# Patient Record
Sex: Female | Born: 1985 | Race: White | Hispanic: Yes | Marital: Married | Smoking: Never smoker
Health system: Southern US, Community
[De-identification: ages and names within clinical notes are randomized; demographics above are authoritative.]

## PROBLEM LIST (undated history)

## (undated) DIAGNOSIS — R87629 Unspecified abnormal cytological findings in specimens from vagina: Secondary | ICD-10-CM

## (undated) DIAGNOSIS — O24419 Gestational diabetes mellitus in pregnancy, unspecified control: Secondary | ICD-10-CM

## (undated) HISTORY — DX: Gestational diabetes mellitus in pregnancy, unspecified control: O24.419

## (undated) HISTORY — DX: Unspecified abnormal cytological findings in specimens from vagina: R87.629

## (undated) HISTORY — PX: GALLBLADDER SURGERY: SHX652

---

## 2004-05-24 ENCOUNTER — Ambulatory Visit: Payer: Self-pay | Admitting: Family Medicine

## 2007-07-23 ENCOUNTER — Ambulatory Visit: Payer: Self-pay | Admitting: Family Medicine

## 2007-12-03 ENCOUNTER — Inpatient Hospital Stay: Payer: Self-pay | Admitting: Obstetrics and Gynecology

## 2014-12-04 ENCOUNTER — Other Ambulatory Visit: Payer: Self-pay | Admitting: Advanced Practice Midwife

## 2014-12-07 ENCOUNTER — Other Ambulatory Visit: Payer: Self-pay | Admitting: Advanced Practice Midwife

## 2014-12-08 ENCOUNTER — Ambulatory Visit: Admission: RE | Admit: 2014-12-08 | Payer: Self-pay | Source: Ambulatory Visit

## 2014-12-08 ENCOUNTER — Ambulatory Visit
Admission: RE | Admit: 2014-12-08 | Discharge: 2014-12-08 | Disposition: A | Discharge status: Home / Self Care | Payer: Medicaid Other | Source: Ambulatory Visit | Attending: Advanced Practice Midwife | Admitting: Advanced Practice Midwife

## 2014-12-08 DIAGNOSIS — Z36 Encounter for antenatal screening of mother: Secondary | ICD-10-CM | POA: Diagnosis present

## 2014-12-08 DIAGNOSIS — Z3A09 9 weeks gestation of pregnancy: Secondary | ICD-10-CM | POA: Diagnosis not present

## 2014-12-08 DIAGNOSIS — O3660X0 Maternal care for excessive fetal growth, unspecified trimester, not applicable or unspecified: Secondary | ICD-10-CM | POA: Insufficient documentation

## 2015-01-17 NOTE — L&D Delivery Note (Signed)
Delivery Note At 5:25 AM a viable female infant was delivered via Vaginal, Spontaneous Delivery (Presentation: LOA;  ).  APGAR: 8, 9; weight 8 lb 7.8 oz (3850 g).   Placenta status: Intact, Spontaneous.  Cord: 3 vessels with the following complications: None.    Anesthesia: None  Episiotomy: None Lacerations: 2nd degree Suture Repair: 2.0 3.0 vicryl Est. Blood Loss (mL):  400cc  Mom to postpartum.  Baby to Couplet care / Skin to Skin.  Leonette MostJohanna K Brailyn Killion 07/12/2015, 5:45 AM

## 2015-02-15 ENCOUNTER — Other Ambulatory Visit: Payer: Self-pay | Admitting: Advanced Practice Midwife

## 2015-02-15 DIAGNOSIS — Z3492 Encounter for supervision of normal pregnancy, unspecified, second trimester: Secondary | ICD-10-CM

## 2015-02-19 ENCOUNTER — Ambulatory Visit
Admission: RE | Admit: 2015-02-19 | Discharge: 2015-02-19 | Disposition: A | Discharge status: Home / Self Care | Payer: Self-pay | Source: Ambulatory Visit | Attending: Advanced Practice Midwife | Admitting: Advanced Practice Midwife

## 2015-02-19 DIAGNOSIS — Z3492 Encounter for supervision of normal pregnancy, unspecified, second trimester: Secondary | ICD-10-CM

## 2015-02-19 DIAGNOSIS — Z3482 Encounter for supervision of other normal pregnancy, second trimester: Secondary | ICD-10-CM | POA: Insufficient documentation

## 2015-05-06 ENCOUNTER — Encounter: Payer: Self-pay | Admitting: *Deleted

## 2015-05-06 ENCOUNTER — Encounter: Payer: Self-pay | Attending: Family Medicine | Admitting: *Deleted

## 2015-05-06 VITALS — BP 114/78 | Ht 60.0 in | Wt 162.2 lb

## 2015-05-06 DIAGNOSIS — O24410 Gestational diabetes mellitus in pregnancy, diet controlled: Secondary | ICD-10-CM

## 2015-05-06 DIAGNOSIS — O24419 Gestational diabetes mellitus in pregnancy, unspecified control: Secondary | ICD-10-CM | POA: Insufficient documentation

## 2015-05-06 NOTE — Patient Instructions (Signed)
Read booklet on Gestational Diabetes Follow Gestational Meal Planning Guidelines Check blood sugars 4 x day - before breakfast and 2 hrs after every meal and record  Bring blood sugar log to all appointments Call MD for prescription for meter strips and lancets Strips  Accu-Chek Aviva  Lancets  Accu-Check Fastclix Walk 20-30 minutes at least 5 x week if permitted by MD

## 2015-05-06 NOTE — Progress Notes (Signed)
Diabetes Self-Management Education  Visit Type: First/Initial  Appt. Start Time: 1320 Appt. End Time: 1500  05/06/2015  Rhonda Kramer, identified by name and date of birth, is a 30 y.o. female with a diagnosis of Diabetes: Gestational Diabetes.   ASSESSMENT  Blood pressure 114/78, height 5' (1.524 m), weight 162 lb 3.2 oz (73.573 kg), last menstrual period 10/08/2014. Body mass index is 31.68 kg/(m^2).      Diabetes Self-Management Education - 05/06/15 1523    Visit Information   Visit Type First/Initial   Initial Visit   Diabetes Type Gestational Diabetes   Are you currently following a meal plan? No   Are you taking your medications as prescribed? Yes   Date Diagnosed "few days ago"   Health Coping   How would you rate your overall health? Good   Psychosocial Assessment   Patient Belief/Attitude about Diabetes Motivated to manage diabetes   Self-care barriers English as a second language   Self-management support Doctor's office   Other persons present Interpreter   Patient Concerns Glycemic Control   Special Needs Other (comment)  Spanish handouts   Preferred Learning Style Auditory;Visual   Learning Readiness Ready   How often do you need to have someone help you when you read instructions, pamphlets, or other written materials from your doctor or pharmacy? 1 - Never   What is the last grade level you completed in school? high school   Complications   How often do you check your blood sugar? 0 times/day (not testing)  Provided Accu-Check Aviva Connect meter and instructed on use. BG upon return demonstration was 74 mg/L at 2:55 pm - 6 hrs pp   Have you had a dilated eye exam in the past 12 months? No   Have you had a dental exam in the past 12 months? No   Are you checking your feet? Yes   How many days per week are you checking your feet? 7   Dietary Intake   Breakfast eggs, beans, and makes her own fruit juice   Lunch chicken soup with rice or beans   Dinner small fruit/apple   Beverage(s) water   Exercise   Exercise Type Light (walking / raking leaves)   How many days per week to you exercise? 7   How many minutes per day do you exercise? 60   Total minutes per week of exercise 420   Patient Education   Previous Diabetes Education Yes (please comment)  GDM instruction at [facility] 10 years ago   Disease state  Definition of diabetes, type 1 and 2, and the diagnosis of diabetes   Nutrition management  Role of diet in the treatment of diabetes and the relationship between the three main macronutrients and blood glucose level   Physical activity and exercise  Role of exercise on diabetes management, blood pressure control and cardiac health.   Monitoring Taught/evaluated SMBG meter.;Purpose and frequency of SMBG.   Chronic complications Relationship between chronic complications and blood glucose control   Psychosocial adjustment Identified and addressed patients feelings and concerns about diabetes   Preconception care Pregnancy and GDM  Role of pre-pregnancy blood glucose control on the development of the fetus;Reviewed with patient blood glucose goals with pregnancy;Role of family planning for patients with diabetes   Personal strategies to promote health Helped patient develop diabetes management plan for (enter comment)  obtaining lower cost glucometer strips as patient doesn't have insurance   Individualized Goals (developed by patient)  Reducing Risk Other (comment)  Improving blood sugars   Outcomes   Expected Outcomes Demonstrated interest in learning. Expect positive outcomes   Program Status Not Completed      Individualized Plan for Diabetes Self-Management Training:   Learning Objective:  Patient will have a greater understanding of diabetes self-management. Patient education plan is to attend individual and/or group sessions per assessed needs and concerns.   Plan:   Patient Instructions  Read booklet on  Gestational Diabetes Follow Gestational Meal Planning Guidelines Check blood sugars 4 x day - before breakfast and 2 hrs after every meal and record  Bring blood sugar log to all appointments Call MD for prescription for meter strips and lancets Strips  Accu-Chek Aviva  Lancets  Accu-Check Fastclix Walk 20-30 minutes at least 5 x week if permitted by MD   Expected Outcomes:  Demonstrated interest in learning. Expect positive outcomes  Education material provided: (Spanish) Gestational Booklet Gestational Meal Planning Guidelines Viewed Gestational Diabetes Video Meter - Accu-Chek Aviva Connect Goals for a Healthy Pregnancy  If problems or questions, patient to contact team via:   Sharion SettlerSheila Akeria Hedstrom, RN, CCM, CDE [phone]  Future DSME appointment:  Pt doesn't have insurance and doesn't want to return for any further diabetes education at this time.

## 2015-06-23 LAB — OB RESULTS CONSOLE GC/CHLAMYDIA
CHLAMYDIA, DNA PROBE: NEGATIVE
Gonorrhea: NEGATIVE

## 2015-06-23 LAB — OB RESULTS CONSOLE GBS: STREP GROUP B AG: NEGATIVE

## 2015-07-10 ENCOUNTER — Encounter: Payer: Self-pay | Admitting: *Deleted

## 2015-07-10 ENCOUNTER — Observation Stay
Admission: EM | Admit: 2015-07-10 | Discharge: 2015-07-11 | Disposition: A | Discharge status: Home / Self Care | Payer: Medicaid Other | Source: Home / Self Care | Admitting: Obstetrics and Gynecology

## 2015-07-10 DIAGNOSIS — Z3A40 40 weeks gestation of pregnancy: Secondary | ICD-10-CM

## 2015-07-10 DIAGNOSIS — O24410 Gestational diabetes mellitus in pregnancy, diet controlled: Secondary | ICD-10-CM

## 2015-07-10 DIAGNOSIS — O479 False labor, unspecified: Secondary | ICD-10-CM | POA: Diagnosis present

## 2015-07-10 DIAGNOSIS — O471 False labor at or after 37 completed weeks of gestation: Secondary | ICD-10-CM

## 2015-07-10 LAB — GLUCOSE, CAPILLARY: GLUCOSE-CAPILLARY: 73 mg/dL (ref 65–99)

## 2015-07-10 MED ORDER — CALCIUM CARBONATE ANTACID 500 MG PO CHEW: 2.0000 | CHEWABLE_TABLET | ORAL | Status: DC | PRN

## 2015-07-10 MED ORDER — ACETAMINOPHEN 325 MG PO TABS: 650.0000 mg | ORAL_TABLET | ORAL | Status: DC | PRN

## 2015-07-10 MED ORDER — PRENATAL MULTIVITAMIN CH: 1.0000 | ORAL_TABLET | Freq: Every day | ORAL | Status: DC

## 2015-07-10 MED ORDER — DOCUSATE SODIUM 100 MG PO CAPS: 100.0000 mg | ORAL_CAPSULE | Freq: Every day | ORAL | Status: DC

## 2015-07-10 MED ORDER — ZOLPIDEM TARTRATE 5 MG PO TABS: 5.0000 mg | ORAL_TABLET | Freq: Every evening | ORAL | Status: DC | PRN

## 2015-07-10 NOTE — Progress Notes (Signed)
Pt. Ambulating hallway per MD order, telemetry monitors attempted - unable to trace fetal heart rate while walking hallway.  Pt. Instructed to return to room in 30-40 minutes to obtain a 20 minute fetal tracing. Pt. Will be allowed to continue to walk if fetal tracing reactive.

## 2015-07-10 NOTE — OB Triage Note (Signed)
Contractions started today at 6 p.m., contractions getting stronger, no gush of fluid, positive for fetal movement.  Pt. States, she has not checked her blood sugar today.  Interpreter at the bedside.

## 2015-07-11 NOTE — Discharge Summary (Signed)
MD NOTE  LMP: 10/08/14 EDD: 07/11/15  HPI: [id number] G3P2002 @ 40+0 wks here for CTX. A1GDM, FS well controlled. No lof, no vb. Pain tolerable.   APC: [facility] Issues this pregnancy 1. A1GDM  History OB History    Gravida Para Term Preterm AB TAB SAB Ectopic Multiple Living   3 2 2       2      Past Medical History  Diagnosis Date  . Gestational diabetes    History reviewed. No pertinent past surgical history. Family History: family history includes Diabetes in her mother. Social History:  reports that she has never smoked. She has never used smokeless tobacco. She reports that she does not drink alcohol or use illicit drugs.   ROS Otherwise neg  Dilation: 3 Effacement (%): 70 Station: -3 Exam by:: Millner, RN  same over 2 hours Blood pressure 138/81, pulse 64, temperature 98.2 F (36.8 C), temperature source Oral, weight 75.751 kg (167 lb), last menstrual period 10/08/2014.  Exam Physical Exam  General appearance: alert and no distress Lungs: clear to auscultation bilaterally Heart: regular rate and rhythm, S1, S2 normal, no murmur, click, rub or gallop Abdomen: soft, non-tender; bowel sounds normal; no masses,  no organomegaly, EFW 4000g Extremities: extremities normal, atraumatic, no cyanosis or edema Skin: Skin color, texture, turgor normal. No rashes or lesions Prenatal labs: See [facility] records  GBS: Negative (06/07 0000)   EFM: 150 mod var +accels, no decels Toco: q446min  Assessment/Plan: 30yo G3P2002 @ 40+0 wks with A1GDM with ctx but no cervical change. Patient has f/u appt on Tuesday to plan IOL given A1GDM. Offered IOL tonight, but patient prefers to wait to see if she continues to ctx on her own. Precautions given. Fs 75 in triage. Stable for d/c home.   Loistine SimasJohanna K Jaylene Schrom 07/11/2015, 12:00 PM

## 2015-07-11 NOTE — Discharge Instructions (Signed)
Spanish interpreter at the bedside:  Labor precautions (decreased fetal movement, fetal kick count, contractions, gush of fluid, vaginal bleeding) discussed with patient, pt. verbalized understanding. Blood sugar checks also discussed with patient.  Spouse at the bedside, very supportive. Pt. Has follow-up appt. On Tuesday, July 13, 2015 [facility]; pt. Encouraged to keep appt.

## 2015-07-12 ENCOUNTER — Inpatient Hospital Stay
Admission: RE | Admit: 2015-07-12 | Discharge: 2015-07-14 | DRG: 775 | Disposition: A | Discharge status: Home / Self Care | Payer: Medicaid Other | Source: Ambulatory Visit | Attending: Obstetrics and Gynecology | Admitting: Obstetrics and Gynecology

## 2015-07-12 DIAGNOSIS — Z3A40 40 weeks gestation of pregnancy: Secondary | ICD-10-CM

## 2015-07-12 DIAGNOSIS — O24424 Gestational diabetes mellitus in childbirth, insulin controlled: Secondary | ICD-10-CM | POA: Diagnosis present

## 2015-07-12 LAB — CBC
HEMATOCRIT: 37.2 % (ref 35.0–47.0)
HEMOGLOBIN: 12.7 g/dL (ref 12.0–16.0)
MCH: 31.9 pg (ref 26.0–34.0)
MCHC: 34.3 g/dL (ref 32.0–36.0)
MCV: 93.1 fL (ref 80.0–100.0)
Platelets: 178 10*3/uL (ref 150–440)
RBC: 3.99 MIL/uL (ref 3.80–5.20)
RDW: 16.5 % — ABNORMAL HIGH (ref 11.5–14.5)
WBC: 7.5 10*3/uL (ref 3.6–11.0)

## 2015-07-12 LAB — GLUCOSE, CAPILLARY: Glucose-Capillary: 107 mg/dL — ABNORMAL HIGH (ref 65–99)

## 2015-07-12 LAB — TYPE AND SCREEN
ABO/RH(D): O POS
Antibody Screen: NEGATIVE

## 2015-07-12 MED ORDER — DIPHENHYDRAMINE HCL 25 MG PO CAPS: 25.0000 mg | ORAL_CAPSULE | Freq: Four times a day (QID) | ORAL | Status: DC | PRN

## 2015-07-12 MED ORDER — IBUPROFEN 600 MG PO TABS
600.0000 mg | ORAL_TABLET | Freq: Four times a day (QID) | ORAL | Status: DC
  Administered 2015-07-12 (×2): 600 mg via ORAL
  Filled 2015-07-12 (×3): qty 1

## 2015-07-12 MED ORDER — SENNOSIDES-DOCUSATE SODIUM 8.6-50 MG PO TABS
2.0000 | ORAL_TABLET | ORAL | Status: DC
  Administered 2015-07-12: 2 via ORAL

## 2015-07-12 MED ORDER — SIMETHICONE 80 MG PO CHEW: 80.0000 mg | CHEWABLE_TABLET | ORAL | Status: DC | PRN

## 2015-07-12 MED ORDER — SODIUM CHLORIDE 0.9% FLUSH: 3.0000 mL | Freq: Two times a day (BID) | INTRAVENOUS | Status: DC

## 2015-07-12 MED ORDER — OXYTOCIN BOLUS FROM INFUSION: 500.0000 mL | INTRAVENOUS | Status: DC

## 2015-07-12 MED ORDER — SOD CITRATE-CITRIC ACID 500-334 MG/5ML PO SOLN: 30.0000 mL | ORAL | Status: DC | PRN

## 2015-07-12 MED ORDER — OXYCODONE-ACETAMINOPHEN 5-325 MG PO TABS
1.0000 | ORAL_TABLET | ORAL | Status: DC | PRN
  Administered 2015-07-12: 1 via ORAL
  Filled 2015-07-12: qty 1

## 2015-07-12 MED ORDER — BUTORPHANOL TARTRATE 1 MG/ML IJ SOLN: 1.0000 mg | INTRAMUSCULAR | Status: DC | PRN

## 2015-07-12 MED ORDER — LIDOCAINE HCL (PF) 1 % IJ SOLN
INTRAMUSCULAR | Status: AC
  Administered 2015-07-12: 30 mL via SUBCUTANEOUS
  Filled 2015-07-12: qty 30

## 2015-07-12 MED ORDER — BENZOCAINE-MENTHOL 20-0.5 % EX AERO: 1.0000 "application " | INHALATION_SPRAY | CUTANEOUS | Status: DC | PRN

## 2015-07-12 MED ORDER — OXYCODONE-ACETAMINOPHEN 5-325 MG PO TABS
2.0000 | ORAL_TABLET | ORAL | Status: DC | PRN
  Administered 2015-07-12: 2 via ORAL
  Filled 2015-07-12: qty 2

## 2015-07-12 MED ORDER — SODIUM CHLORIDE 0.9 % IV SOLN: 250.0000 mL | INTRAVENOUS | Status: DC | PRN

## 2015-07-12 MED ORDER — TETANUS-DIPHTH-ACELL PERTUSSIS 5-2.5-18.5 LF-MCG/0.5 IM SUSP
0.5000 mL | Freq: Once | INTRAMUSCULAR | Status: AC
  Administered 2015-07-14: 0.5 mL via INTRAMUSCULAR
  Filled 2015-07-12: qty 0.5

## 2015-07-12 MED ORDER — ONDANSETRON HCL 4 MG/2ML IJ SOLN: 4.0000 mg | INTRAMUSCULAR | Status: DC | PRN

## 2015-07-12 MED ORDER — WITCH HAZEL-GLYCERIN EX PADS: 1.0000 "application " | MEDICATED_PAD | CUTANEOUS | Status: DC | PRN

## 2015-07-12 MED ORDER — OXYCODONE HCL 5 MG PO TABS
5.0000 mg | ORAL_TABLET | ORAL | Status: DC | PRN
  Administered 2015-07-14: 5 mg via ORAL
  Filled 2015-07-12: qty 1

## 2015-07-12 MED ORDER — ACETAMINOPHEN 325 MG PO TABS: 650.0000 mg | ORAL_TABLET | ORAL | Status: DC | PRN

## 2015-07-12 MED ORDER — ZOLPIDEM TARTRATE 5 MG PO TABS: 5.0000 mg | ORAL_TABLET | Freq: Every evening | ORAL | Status: DC | PRN

## 2015-07-12 MED ORDER — PRENATAL MULTIVITAMIN CH
1.0000 | ORAL_TABLET | Freq: Every day | ORAL | Status: DC
  Administered 2015-07-13 – 2015-07-14 (×2): 1 via ORAL
  Filled 2015-07-12 (×2): qty 1

## 2015-07-12 MED ORDER — OXYTOCIN 10 UNIT/ML IJ SOLN
INTRAMUSCULAR | Status: AC
  Filled 2015-07-12: qty 2

## 2015-07-12 MED ORDER — COCONUT OIL OIL: 1.0000 "application " | TOPICAL_OIL | Status: DC | PRN

## 2015-07-12 MED ORDER — SODIUM CHLORIDE 0.9% FLUSH: 3.0000 mL | INTRAVENOUS | Status: DC | PRN

## 2015-07-12 MED ORDER — OXYCODONE HCL 5 MG PO TABS: 10.0000 mg | ORAL_TABLET | ORAL | Status: DC | PRN

## 2015-07-12 MED ORDER — LACTATED RINGERS IV SOLN
INTRAVENOUS | Status: DC
  Administered 2015-07-12: 04:00:00 via INTRAVENOUS

## 2015-07-12 MED ORDER — MISOPROSTOL 200 MCG PO TABS
ORAL_TABLET | ORAL | Status: AC
  Filled 2015-07-12: qty 4

## 2015-07-12 MED ORDER — LIDOCAINE HCL (PF) 1 % IJ SOLN
30.0000 mL | INTRAMUSCULAR | Status: DC | PRN
  Administered 2015-07-12: 30 mL via SUBCUTANEOUS

## 2015-07-12 MED ORDER — OXYTOCIN 40 UNITS IN LACTATED RINGERS INFUSION - SIMPLE MED
2.5000 [IU]/h | INTRAVENOUS | Status: DC
  Administered 2015-07-12: 2.5 [IU]/h via INTRAVENOUS
  Filled 2015-07-12: qty 1000

## 2015-07-12 MED ORDER — ONDANSETRON HCL 4 MG PO TABS: 4.0000 mg | ORAL_TABLET | ORAL | Status: DC | PRN

## 2015-07-12 MED ORDER — ONDANSETRON HCL 4 MG/2ML IJ SOLN: 4.0000 mg | Freq: Four times a day (QID) | INTRAMUSCULAR | Status: DC | PRN

## 2015-07-12 MED ORDER — LACTATED RINGERS IV SOLN: 500.0000 mL | INTRAVENOUS | Status: DC | PRN

## 2015-07-12 MED ORDER — DIBUCAINE 1 % RE OINT: 1.0000 "application " | TOPICAL_OINTMENT | RECTAL | Status: DC | PRN

## 2015-07-12 MED ORDER — AMMONIA AROMATIC IN INHA
RESPIRATORY_TRACT | Status: AC
  Filled 2015-07-12: qty 10

## 2015-07-12 NOTE — OB Triage Note (Signed)
Pt. states she experienced contractions all day yesterday, but they began to intensify and increase in frequency at approximately 2330. She sates she has had some blood-tinged mucous discharge but denies other VB or LOF. Endorses good fetal movement.

## 2015-07-12 NOTE — H&P (Signed)
MD NOTE  LMP: 10/08/14 EDD: 07/11/15  HPI: [id number] G3P2002 @ 40+1 wks here for CTX. A1GDM, FS well controlled. No lof, no vb. More uncomfortable from yesterday.  APC: [facility] Issues this pregnancy 1. A1GDM  History OB History    Gravida Para Term Preterm AB TAB SAB Ectopic Multiple Living   3 2 2       2      Past Medical History  Diagnosis Date  . Gestational diabetes    History reviewed. No pertinent past surgical history. Family History: family history includes Diabetes in her mother. Social History:  reports that she has never smoked. She has never used smokeless tobacco. She reports that she does not drink alcohol or use illicit drugs.   ROS Otherwise neg  Dilation: 6 Effacement (%): 100 Station: -2  AROM'd for tinged mec  There were no vitals filed for this visit.   Exam Physical Exam  General appearance: alert and no distress Lungs: clear to auscultation bilaterally Heart: regular rate and rhythm, S1, S2 normal, no murmur, click, rub or gallop Abdomen: soft, non-tender; bowel sounds normal; no masses, no organomegaly, EFW 4000g Extremities: extremities normal, atraumatic, no cyanosis or edema Skin: Skin color, texture, turgor normal. No rashes or lesions Prenatal labs: See [facility] records RI, VZV I, Hbsag neg, RPR NR, HIV neg, O+,  GC/CT neg GBS: Negative (06/07 0000)   EFM: 150 mod var +accels, no decels Toco: q442min  Assessment/Plan: 30yo G3P2002 @ 40+1 wks with A1GDM in labor.  - FS now and q2H in active labor - cont EFM,toco - EFW 4300g, shoulder precautions - GBS neg  Anticipate NSVD  Ala DachJohanna K Pippa Hanif, MD

## 2015-07-12 NOTE — OB Triage Note (Signed)
[facility]. Pt placed on monitor and cervical exam done.

## 2015-07-13 LAB — CBC
HEMATOCRIT: 36.2 % (ref 35.0–47.0)
HEMOGLOBIN: 12.4 g/dL (ref 12.0–16.0)
MCH: 31.6 pg (ref 26.0–34.0)
MCHC: 34.2 g/dL (ref 32.0–36.0)
MCV: 92.5 fL (ref 80.0–100.0)
Platelets: 166 10*3/uL (ref 150–440)
RBC: 3.92 MIL/uL (ref 3.80–5.20)
RDW: 16.4 % — ABNORMAL HIGH (ref 11.5–14.5)
WBC: 8.4 10*3/uL (ref 3.6–11.0)

## 2015-07-13 LAB — RPR: RPR Ser Ql: NONREACTIVE

## 2015-07-13 MED ORDER — IBUPROFEN 600 MG PO TABS
600.0000 mg | ORAL_TABLET | Freq: Four times a day (QID) | ORAL | Status: DC
  Administered 2015-07-13 – 2015-07-14 (×7): 600 mg via ORAL
  Filled 2015-07-13 (×6): qty 1

## 2015-07-13 NOTE — Progress Notes (Signed)
Post Partum Day 1 Subjective: no complaints Baby bili elevated  Objective: Blood pressure 126/77, pulse 66, temperature 98.5 F (36.9 C), temperature source Oral, resp. rate 18, last menstrual period 10/08/2014, SpO2 100 %, unknown if currently breastfeeding.  Physical Exam:  General: alert and cooperative Lochia: appropriate Uterine Fundus: firm Incision: n/a DVT Evaluation: No evidence of DVT seen on physical exam.   Recent Labs  07/12/15 0345 07/13/15 0452  HGB 12.7 12.4  HCT 37.2 36.2    Assessment/Plan: Plan for discharge tomorrow given bilirubin ( baby) elevated    LOS: 1 day   SCHERMERHORN,THOMAS 07/13/2015, 8:51 AM

## 2015-07-14 MED ORDER — IBUPROFEN 600 MG PO TABS: 600.0000 mg | ORAL_TABLET | Freq: Four times a day (QID) | ORAL | Status: DC

## 2015-07-14 MED ORDER — BENZOCAINE-MENTHOL 20-0.5 % EX AERO: 1.0000 "application " | INHALATION_SPRAY | CUTANEOUS | Status: DC | PRN

## 2015-07-14 NOTE — Discharge Summary (Signed)
Obstetric Discharge Summary Reason for Admission: onset of labor Prenatal Procedures: none Intrapartum Procedures: spontaneous vaginal delivery Postpartum Procedures: none Complications-Operative and Postpartum: none HEMOGLOBIN  Date Value Ref Range Status  07/13/2015 12.4 12.0 - 16.0 g/dL Final   HCT  Date Value Ref Range Status  07/13/2015 36.2 35.0 - 47.0 % Final    Physical Exam:  General: alert, cooperative and appears stated age 55Lochia: appropriate Uterine Fundus: firm Incision: healing well DVT Evaluation: No evidence of DVT seen on physical exam.  Discharge Diagnoses: Term Pregnancy-delivered  Discharge Information: Date: 07/14/2015 Activity: pelvic rest Diet: routine Medications: Ibuprofen Condition: stable Instructions: refer to practice specific booklet Discharge to: home - however, baby bilirubin still elevated and if not able to go home today, patient will stay as peds parent Follow-up Information    Follow up with Ala DachJohanna K Halfon, MD In 6 weeks.   Specialty:  Obstetrics and Gynecology   Why:  For postpartum visit   Contact information:   [address] [place] [id number] [phone]       Newborn Data: Live born female  Birth Weight: 8 lb 7.8 oz (3850 g) APGAR: 8, 9  Home with mother, pending bilirubin.  Christeen DouglasBEASLEY, Shiri Hodapp 07/14/2015, 8:38 AM

## 2015-07-14 NOTE — Progress Notes (Signed)
pt discharged home with infant.  Discharge instructions, prescriptions and follow up appointment given to and reviewed with pt.  Pt verbalized understanding, all questions answered.  Escorted by auxiliary. 

## 2015-07-14 NOTE — Discharge Instructions (Signed)
Parto vaginal, Cuidados posteriores  °(Vaginal Delivery, Care After) °Siga estas instrucciones durante las próximas semanas. Estas indicaciones para el alta le proporcionan información general acerca de cómo deberá cuidarse después del parto. El médico también podrá darle instrucciones específicas. El tratamiento ha sido planificado según las prácticas médicas actuales, pero en algunos casos pueden ocurrir problemas. Comuníquese con el médico si tiene algún problema o tiene preguntas al volver a su casa.  °INSTRUCCIONES PARA EL CUIDADO EN EL HOGAR  °· Tome sólo medicamentos de venta libre o recetados, según las indicaciones del médico o del farmacéutico. °· No beba alcohol, especialmente si está amamantando o toma analgésicos. °· No mastique tabaco ni fume. °· No consuma drogas. °· Continúe con un adecuado cuidado perineal. El buen cuidado perineal incluye: °¨ Higienizarse de adelante hacia atrás. °¨ Mantener la zona perineal limpia. °· No use tampones ni duchas vaginales hasta que su médico la autorice. °· Dúchese, lávese el cabello y tome baños de inmersión según las indicaciones de su médico. °· Utilice un sostén que le ajuste bien y que brinde buen soporte a sus mamas. °· Consuma alimentos saludables. °· Beba suficiente líquido para mantener la orina clara o de color amarillo pálido. °· Consuma alimentos ricos en fibra como cereales y panes integrales, arroz, frijoles y frutas y verduras frescas todos los días. Estos alimentos pueden ayudarla a prevenir o aliviar el estreñimiento. °· Siga las recomendaciones de su médico relacionadas con la reanudación de actividades como subir escaleras, conducir automóviles, levantar objetos, hacer ejercicios o viajar. °· Hable con su médico acerca de reanudar la actividad sexual. Volver a la actividad sexual depende del riesgo de infección, la velocidad de la curación y la comodidad y su deseo de reanudarla. °· Trate de que alguien la ayude con las actividades del hogar y con  el recién nacido al menos durante un par de días después de salir del hospital. °· Descanse todo lo que pueda. Trate de descansar o tomar una siesta mientras el bebé está durmiendo. °· Aumente sus actividades gradualmente. °· Cumpla con todas las visitas de control programadas para después del parto. Es muy importante asistir a todas las citas programadas de seguimiento. En estas citas, su médico va a controlarla para asegurarse de que esté sanando física y emocionalmente. °SOLICITE ATENCIÓN MÉDICA SI:  °· Elimina coágulos grandes por la vagina. Guarde algunos coágulos para mostrarle al médico. °· Tiene una secreción con feo olor que proviene de la vagina. °· Tiene dificultad para orinar. °· Orina con frecuencia. °· Siente dolor al orinar. °· Nota un cambio en sus movimientos intestinales. °· Aumenta el enrojecimiento, el dolor o la hinchazón en la zona de la incisión vaginal (episiotomía) o el desgarro vaginal. °· Tiene pus que drena por la episiotomía o el desgarro vaginal. °· La episiotomía o el desgarro vaginal se abren. °· Sus mamas le duelen, están duras o enrojecidas. °· Sufre un dolor intenso de cabeza. °· Tiene visión borrosa o ve manchas. °· Se siente triste o deprimida. °· Tiene pensamientos acerca de lastimarse o dañar al recién nacido. °· Tiene preguntas acerca de su cuidado personal, el cuidado del recién nacido o acerca de los medicamentos. °· Se siente mareada o sufre un desmayo. °· Tiene una erupción. °· Tiene náuseas o vómitos. °· Usted amamantó al bebé y no ha tenido su período menstrual dentro de las 12 semanas después de dejar de amamantar. °· No amamanta al bebé y no tuvo su período menstrual en las últimas 12° semanas después del   parto. °· Tiene fiebre. °SOLICITE ATENCIÓN MÉDICA DE INMEDIATO SI:  °· Siente dolor persistente. °· Siente dolor en el pecho. °· Le falta el aire. °· Se desmaya. °· Siente dolor en la pierna. °· Siente dolor en el estómago. °· El sangrado vaginal satura dos o más  apósitos en 1 hora. °  °Esta información no tiene como fin reemplazar el consejo del médico. Asegúrese de hacerle al médico cualquier pregunta que tenga. °  °Document Released: 01/02/2005 Document Revised: 09/23/2014 °Elsevier Interactive Patient Education ©2016 [employer] Inc. ° °

## 2018-10-02 ENCOUNTER — Encounter: Payer: Self-pay | Admitting: Emergency Medicine

## 2018-10-02 ENCOUNTER — Emergency Department
Admission: EM | Admit: 2018-10-02 | Discharge: 2018-10-02 | Disposition: A | Discharge status: Home / Self Care | Payer: Self-pay | Attending: Emergency Medicine | Admitting: Emergency Medicine

## 2018-10-02 ENCOUNTER — Other Ambulatory Visit: Payer: Self-pay

## 2018-10-02 ENCOUNTER — Emergency Department: Payer: Self-pay

## 2018-10-02 DIAGNOSIS — K8042 Calculus of bile duct with acute cholecystitis without obstruction: Secondary | ICD-10-CM | POA: Insufficient documentation

## 2018-10-02 DIAGNOSIS — R1011 Right upper quadrant pain: Secondary | ICD-10-CM | POA: Insufficient documentation

## 2018-10-02 DIAGNOSIS — K8050 Calculus of bile duct without cholangitis or cholecystitis without obstruction: Secondary | ICD-10-CM

## 2018-10-02 DIAGNOSIS — Z79899 Other long term (current) drug therapy: Secondary | ICD-10-CM | POA: Insufficient documentation

## 2018-10-02 LAB — URINALYSIS, COMPLETE (UACMP) WITH MICROSCOPIC
Bacteria, UA: NONE SEEN
Bilirubin Urine: NEGATIVE
Glucose, UA: NEGATIVE mg/dL
Hgb urine dipstick: NEGATIVE
Ketones, ur: NEGATIVE mg/dL
Leukocytes,Ua: NEGATIVE
Nitrite: NEGATIVE
Protein, ur: NEGATIVE mg/dL
Specific Gravity, Urine: 1.015 (ref 1.005–1.030)
pH: 8 (ref 5.0–8.0)

## 2018-10-02 LAB — CBC WITH DIFFERENTIAL/PLATELET
Abs Immature Granulocytes: 0.03 10*3/uL (ref 0.00–0.07)
Basophils Absolute: 0 10*3/uL (ref 0.0–0.1)
Basophils Relative: 0 %
Eosinophils Absolute: 0.1 10*3/uL (ref 0.0–0.5)
Eosinophils Relative: 1 %
HCT: 37.9 % (ref 36.0–46.0)
Hemoglobin: 12.6 g/dL (ref 12.0–15.0)
Immature Granulocytes: 0 %
Lymphocytes Relative: 38 %
Lymphs Abs: 3.5 10*3/uL (ref 0.7–4.0)
MCH: 29.8 pg (ref 26.0–34.0)
MCHC: 33.2 g/dL (ref 30.0–36.0)
MCV: 89.6 fL (ref 80.0–100.0)
Monocytes Absolute: 0.7 10*3/uL (ref 0.1–1.0)
Monocytes Relative: 8 %
Neutro Abs: 4.8 10*3/uL (ref 1.7–7.7)
Neutrophils Relative %: 53 %
Platelets: 296 10*3/uL (ref 150–400)
RBC: 4.23 MIL/uL (ref 3.87–5.11)
RDW: 12.7 % (ref 11.5–15.5)
WBC: 9.2 10*3/uL (ref 4.0–10.5)
nRBC: 0 % (ref 0.0–0.2)

## 2018-10-02 LAB — POCT PREGNANCY, URINE: Preg Test, Ur: NEGATIVE

## 2018-10-02 LAB — LIPASE, BLOOD: Lipase: 28 U/L (ref 11–51)

## 2018-10-02 LAB — BASIC METABOLIC PANEL
Anion gap: 9 (ref 5–15)
BUN: 14 mg/dL (ref 6–20)
CO2: 25 mmol/L (ref 22–32)
Calcium: 9.7 mg/dL (ref 8.9–10.3)
Chloride: 105 mmol/L (ref 98–111)
Creatinine, Ser: 0.64 mg/dL (ref 0.44–1.00)
GFR calc Af Amer: >60 mL/min (ref >60)
GFR calc non Af Amer: >60 mL/min (ref >60)
Glucose, Bld: 102 mg/dL — ABNORMAL HIGH (ref 70–99)
Potassium: 4.4 mmol/L (ref 3.5–5.1)
Sodium: 139 mmol/L (ref 135–145)

## 2018-10-02 LAB — HEPATIC FUNCTION PANEL
ALT: 34 U/L (ref 0–44)
AST: 37 U/L (ref 15–41)
Albumin: 4.5 g/dL (ref 3.5–5.0)
Alkaline Phosphatase: 71 U/L (ref 38–126)
Bilirubin, Direct: 0.1 mg/dL (ref 0.0–0.2)
Indirect Bilirubin: 0.7 mg/dL (ref 0.3–0.9)
Total Bilirubin: 0.8 mg/dL (ref 0.3–1.2)
Total Protein: 8 g/dL (ref 6.5–8.1)

## 2018-10-02 MED ORDER — ONDANSETRON HCL 4 MG/2ML IJ SOLN
4.0000 mg | Freq: Once | INTRAMUSCULAR | Status: AC
  Administered 2018-10-02: 4 mg via INTRAVENOUS
  Filled 2018-10-02: qty 2

## 2018-10-02 MED ORDER — MORPHINE SULFATE (PF) 4 MG/ML IV SOLN
8.0000 mg | Freq: Once | INTRAVENOUS | Status: AC
  Administered 2018-10-02: 15:00:00 8 mg via INTRAVENOUS
  Filled 2018-10-02: qty 2

## 2018-10-02 MED ORDER — LACTATED RINGERS IV BOLUS
1000.0000 mL | Freq: Once | INTRAVENOUS | Status: AC
  Administered 2018-10-02: 1000 mL via INTRAVENOUS

## 2018-10-02 MED ORDER — OXYCODONE-ACETAMINOPHEN 5-325 MG PO TABS: 1.0000 | ORAL_TABLET | ORAL | 0 refills | Status: AC | PRN

## 2018-10-02 MED ORDER — ONDANSETRON 4 MG PO TBDP: 4.0000 mg | ORAL_TABLET | Freq: Three times a day (TID) | ORAL | 0 refills | Status: DC | PRN

## 2018-10-02 NOTE — ED Notes (Signed)
This RN d/c patient with translator- Rafael. Pt and partner verbalized understanding of discharge instructions.

## 2018-10-02 NOTE — ED Notes (Signed)
Pt assisted to the bathroom at this time. Pt c/o dizziness, ambulatory with standby assist. UA collected at this time. Pt back to bed without issue. Will continue to monitor for further patient needs.

## 2018-10-02 NOTE — ED Triage Notes (Addendum)
Pt presents to ED via [facility] with c/o RUQ abdominal pain. Per EMS pt vomited this morning and had sudden onset RUQ abdominal pain. Pt presents to ED noted to be moaning in pain. Per EMS pt with significant guarding to RUQ, but no apparent pain to other quadrants with palpation.   20 g to L hand  CBG 106 156/88 98% RA 84HR  Ronnald Collum, Interpreter at bedside to interpret patient, pt reports that pain has been intermittent x 2 months, seen by  Her PCP and was told that she had a virus. Today had sudden onset again with severe pain.

## 2018-10-02 NOTE — ED Notes (Signed)
Pt returned from US at this time.

## 2018-10-02 NOTE — ED Provider Notes (Signed)
[facility] Emergency Department Provider Note   ____________________________________________   First MD Initiated Contact with Patient 10/02/18 1450     (approximate)  I have reviewed the triage vital signs and the nursing notes.   HISTORY  Chief Complaint Abdominal Cramping    HPI Rhonda Kramer is a 33 y.o. female with no significant past medical history presents to the ED complaining of abdominal pain.  History obtained via in person interpreter given patient is Spanish-speaking only.  Patient reports she has been dealing with intermittent right upper quadrant abdominal pain for at least the past 2 months.  It has been sharp and not exacerbated or alleviated by anything.  She states she was seen by her PCP for this problem and told her it was a virus.  States she woke up this morning, pain in her right upper quadrant has been sharp and severe.  It has persisted throughout the day and so she eventually called EMS.  She had one episode of vomiting earlier today and continues to feel nauseous.  She states her LMP was the 22nd of last month, denies any vaginal bleeding, discharge, dysuria, or hematuria.        Past Medical History:  Diagnosis Date  . Gestational diabetes     Patient Active Problem List   Diagnosis Date Noted  . Normal labor and delivery 07/12/2015  . Irregular uterine contractions 07/10/2015    History reviewed. No pertinent surgical history.  Prior to Admission medications   Medication Sig Start Date End Date Taking? Authorizing Provider  benzocaine-Menthol (DERMOPLAST) 20-0.5 % AERO Apply 1 application topically as needed for irritation (perineal discomfort). Patient not taking: Reported on 10/02/2018 07/14/15   Benjaman Kindler, MD  ibuprofen (ADVIL,MOTRIN) 600 MG tablet Take 1 tablet (600 mg total) by mouth every 6 (six) hours. Patient not taking: Reported on 10/02/2018 07/14/15   Benjaman Kindler, MD  ondansetron (ZOFRAN ODT)  4 MG disintegrating tablet Take 1 tablet (4 mg total) by mouth every 8 (eight) hours as needed for nausea or vomiting. 10/02/18   Blake Divine, MD  oxyCODONE-acetaminophen (PERCOCET) 5-325 MG tablet Take 1 tablet by mouth every 4 (four) hours as needed for up to 5 days for severe pain. 10/02/18 10/07/18  Blake Divine, MD  Prenatal Vit-Fe Fumarate-FA (PRENATAL VITAMIN PO) Take 1 tablet by mouth daily.    [provider]    Allergies Patient has no known allergies.  Family History  Problem Relation Age of Onset  . Diabetes Mother     Social History Social History   Tobacco Use  . Smoking status: Never Smoker  . Smokeless tobacco: Never Used  Substance Use Topics  . Alcohol use: No    Alcohol/week: 0.0 standard drinks  . Drug use: No    Review of Systems  Constitutional: No fever/chills Eyes: No visual changes. ENT: No sore throat. Cardiovascular: Denies chest pain. Respiratory: Denies shortness of breath. Gastrointestinal: Positive for abdominal pain, nausea, and vomiting.  No diarrhea.  No constipation. Genitourinary: Negative for dysuria. Musculoskeletal: Negative for back pain. Skin: Negative for rash. Neurological: Negative for headaches, focal weakness or numbness.  ____________________________________________   PHYSICAL EXAM:  VITAL SIGNS: ED Triage Vitals [10/02/18 1450]  Enc Vitals Group     BP      Pulse      Resp      Temp      Temp src      SpO2      Weight  Height      Head Circumference      Peak Flow      Pain Score 9     Pain Loc      Pain Edu?      Excl. in GC?     Constitutional: Alert and oriented. Eyes: Conjunctivae are normal. Head: Atraumatic. Nose: No congestion/rhinnorhea. Mouth/Throat: Mucous membranes are moist. Neck: Normal ROM Cardiovascular: Normal rate, regular rhythm. Grossly normal heart sounds. Respiratory: Normal respiratory effort.  No retractions. Lungs CTAB. Gastrointestinal: Soft and tender to  palpation in the right upper quadrant with voluntary guarding, otherwise nontender. No distention. Genitourinary: deferred Musculoskeletal: No lower extremity tenderness nor edema. Neurologic:  Normal speech and language. No gross focal neurologic deficits are appreciated. Skin:  Skin is warm, dry and intact. No rash noted. Psychiatric: Mood and affect are normal. Speech and behavior are normal.  ____________________________________________   LABS (all labs ordered are listed, but only abnormal results are displayed)  Labs Reviewed  URINALYSIS, COMPLETE (UACMP) WITH MICROSCOPIC - Abnormal; Notable for the following components:      Result Value   Color, Urine YELLOW (*)    APPearance CLEAR (*)    All other components within normal limits  BASIC METABOLIC PANEL - Abnormal; Notable for the following components:   Glucose, Bld 102 (*)    All other components within normal limits  CBC WITH DIFFERENTIAL/PLATELET  LIPASE, BLOOD  HEPATIC FUNCTION PANEL  POC URINE PREG, ED  POCT PREGNANCY, URINE     PROCEDURES  Procedure(s) performed (including Critical Care):  Procedures   ____________________________________________   INITIAL IMPRESSION / ASSESSMENT AND PLAN / ED COURSE       33 year old female presenting to the ED with intermittent right upper quadrant pain over the past couple of months, now severe with an episode of vomiting today.  This is most consistent with cholecystitis versus biliary colic, will assess with right upper quadrant ultrasound, LFTs, and lipase.  Patient in severe pain at this time, will treat with morphine, Zofran, and IV fluids.  Will need to check urine to ensure patient is not pregnant.  CT abdomen consistent with cholelithiasis without evidence of cholecystitis.  LFTs and lipase within normal limits, patient reports significant improvement in symptoms, now resting comfortably.  Will discharge home with pain and nausea medicine for use as needed,  provided with referral to general surgery clinic.  Counseled patient to return to the ED for new or worsening symptoms, patient agrees with plan.      ____________________________________________   FINAL CLINICAL IMPRESSION(S) / ED DIAGNOSES  Final diagnoses:  RUQ pain  Biliary colic     ED Discharge Orders         Ordered    ondansetron (ZOFRAN ODT) 4 MG disintegrating tablet  Every 8 hours PRN     10/02/18 1732    oxyCODONE-acetaminophen (PERCOCET) 5-325 MG tablet  Every 4 hours PRN     10/02/18 1732           Note:  This document was prepared using Dragon voice recognition software and may include unintentional dictation errors.   Chesley NoonJessup, Caileen Veracruz, MD 10/02/18 2117

## 2018-10-14 ENCOUNTER — Ambulatory Visit (INDEPENDENT_AMBULATORY_CARE_PROVIDER_SITE_OTHER): Payer: Self-pay | Admitting: Surgery

## 2018-10-14 ENCOUNTER — Encounter: Payer: Self-pay | Admitting: Surgery

## 2018-10-14 ENCOUNTER — Other Ambulatory Visit: Payer: Self-pay

## 2018-10-14 VITALS — BP 120/81 | HR 77 | Temp 98.6°F | Ht 59.0 in | Wt 162.0 lb

## 2018-10-14 DIAGNOSIS — K8020 Calculus of gallbladder without cholecystitis without obstruction: Secondary | ICD-10-CM

## 2018-10-14 NOTE — Patient Instructions (Addendum)
Our surgery scheduler will call to schedule your surgery within 24-48 hours. Please have your Blue surgery sheet available when she calls.   Cholelithiasis  Cholelithiasis is also called "gallstones." It is a kind of gallbladder disease. The gallbladder is an organ that stores a liquid (bile) that helps you digest fat. Gallstones may not cause symptoms (may be silent gallstones) until they cause a blockage, and then they can cause pain (gallbladder attack). Follow these instructions at home:  Take over-the-counter and prescription medicines only as told by your doctor.  Stay at a healthy weight.  Eat healthy foods. This includes: ? Eating fewer fatty foods, like fried foods. ? Eating fewer refined carbs (refined carbohydrates). Refined carbs are breads and grains that are highly processed, like white bread and white rice. Instead, choose whole grains like whole-wheat bread and brown rice. ? Eating more fiber. Almonds, fresh fruit, and beans are healthy sources of fiber.  Keep all follow-up visits as told by your doctor. This is important. Contact a doctor if:  You have sudden pain in the upper right side of your belly (abdomen). Pain might spread to your right shoulder or your chest. This may be a sign of a gallbladder attack.  You feel sick to your stomach (are nauseous).  You throw up (vomit).  You have been diagnosed with gallstones that have no symptoms and you get: ? Belly pain. ? Discomfort, burning, or fullness in the upper part of your belly (indigestion). Get help right away if:  You have sudden pain in the upper right side of your belly, and it lasts for more than 2 hours.  You have belly pain that lasts for more than 5 hours.  You have a fever or chills.  You keep feeling sick to your stomach or you keep throwing up.  Your skin or the whites of your eyes turn yellow (jaundice).  You have dark-colored pee (urine).  You have light-colored poop (stool). Summary   Cholelithiasis is also called "gallstones."  The gallbladder is an organ that stores a liquid (bile) that helps you digest fat.  Silent gallstones are gallstones that do not cause symptoms.  A gallbladder attack may cause sudden pain in the upper right side of your belly. Pain might spread to your right shoulder or your chest. If this happens, contact your doctor.  If you have sudden pain in the upper right side of your belly that lasts for more than 2 hours, get help right away. This information is not intended to replace advice given to you by your health care provider. Make sure you discuss any questions you have with your health care provider. Document Released: 06/21/2007 Document Revised: 12/15/2016 Document Reviewed: 09/19/2015 Elsevier Patient Education  2020 [employer].

## 2018-10-14 NOTE — Progress Notes (Signed)
Patient ID: Rhonda Kramer, female   DOB: May 08, 1985, 33 y.o.   MRN: [id number]  HPI Rhonda Kramer is a 33 y.o. female seen in consultation at the request of Dr. Charna Archer.  She has been having right upper quadrant pain for a couple months up to the point that brought her to the emergency room about 10 days ago.  She describes that the pain is in the right upper quadrant, sharp/cramp and moderate in intensity.  Exacerbated by greasy meals.  No evidence of biliary obstruction no evidence of obstructive jaundice. She did have an ultrasound of the right upper quadrant that I have personally reviewed showing evidence of cholelithiasis without cholecystitis.  Normal common bile duct.  She is able to perform more than 4 METS of activity without any shortness of breath or chest pain.  Her CBC and CMP were completely normal.  She denies any previous abdominal operations.  HPI  Past Medical History:  Diagnosis Date  . Gestational diabetes     History reviewed. No pertinent surgical history.  Family History  Problem Relation Age of Onset  . Diabetes Mother     Social History Social History   Tobacco Use  . Smoking status: Never Smoker  . Smokeless tobacco: Never Used  Substance Use Topics  . Alcohol use: No    Alcohol/week: 0.0 standard drinks  . Drug use: No    No Known Allergies  Current Outpatient Medications  Medication Sig Dispense Refill  . benzocaine-Menthol (DERMOPLAST) 20-0.5 % AERO Apply 1 application topically as needed for irritation (perineal discomfort). 56 g 0  . ibuprofen (ADVIL,MOTRIN) 600 MG tablet Take 1 tablet (600 mg total) by mouth every 6 (six) hours. 30 tablet 0  . ondansetron (ZOFRAN ODT) 4 MG disintegrating tablet Take 1 tablet (4 mg total) by mouth every 8 (eight) hours as needed for nausea or vomiting. 12 tablet 0   No current facility-administered medications for this visit.      Review of Systems Full ROS  was asked and was negative except for the  information on the HPI  Physical Exam Blood pressure 120/81, pulse 77, temperature 98.6 F (37 C), temperature source Temporal, height 4\' 11"  (1.499 m), weight 162 lb (73.5 kg), SpO2 98 %, unknown if currently breastfeeding. CONSTITUTIONAL: NAD EYES: Pupils are equal, round, and reactive to light, Sclera are non-icteric. EARS, NOSE, MOUTH AND THROAT: The oropharynx is clear. The oral mucosa is pink and moist. Hearing is intact to voice. LYMPH NODES:  Lymph nodes in the neck are normal. RESPIRATORY:  Lungs are clear. There is normal respiratory effort, with equal breath sounds bilaterally, and without pathologic use of accessory muscles. CARDIOVASCULAR: Heart is regular without murmurs, gallops, or rubs. GI: The abdomen is soft, mild TTP RUQ, no peritonitis or Murphy, and nondistended. There are no palpable masses. There is no hepatosplenomegaly. There are normal bowel sounds in all quadrants. GU: Rectal deferred.   MUSCULOSKELETAL: Normal muscle strength and tone. No cyanosis or edema.   SKIN: Turgor is good and there are no pathologic skin lesions or ulcers. NEUROLOGIC: Motor and sensation is grossly normal. Cranial nerves are grossly intact. PSYCH:  Oriented to person, place and time. Affect is normal.  Data Reviewed  I have personally reviewed the patient's imaging, laboratory findings and medical records.    Assessment/Plan 33 year old female with classic signs and symptoms consistent with biliary colic caused by gallstones.  Discussed with the patient in detail about her disease process.  I do recommend  cholecystectomy.The risks, benefits, complications, treatment options, and expected outcomes were discussed with the patient. The possibilities of bleeding, recurrent infection, finding a normal gallbladder, perforation of viscus organs, damage to surrounding structures, bile leak, abscess formation, needing a drain placed, the need for additional procedures, reaction to medication,  pulmonary aspiration,  failure to diagnose a condition, the possible need to convert to an open procedure, and creating a complication requiring transfusion or operation were discussed with the patient. The patient and/or family concurred with the proposed plan, giving informed consent.  I do think that she will be a good candidate for robotic approach  A copy of the report was sent to the referring provider  Dailyn Reith, MD FACS General Surgeon 10/14/2018, 11:16 AM   

## 2018-10-14 NOTE — H&P (View-Only) (Signed)
Patient ID: Rhonda Kramer, female   DOB: May 08, 1985, 33 y.o.   MRN: [id number]  HPI Rhonda Kramer is a 33 y.o. female seen in consultation at the request of Dr. Charna Kramer.  She has been having right upper quadrant pain for a couple months up to the point that brought her to the emergency room about 10 days ago.  She describes that the pain is in the right upper quadrant, sharp/cramp and moderate in intensity.  Exacerbated by greasy meals.  No evidence of biliary obstruction no evidence of obstructive jaundice. She did have an ultrasound of the right upper quadrant that I have personally reviewed showing evidence of cholelithiasis without cholecystitis.  Normal common bile duct.  She is able to perform more than 4 METS of activity without any shortness of breath or chest pain.  Her CBC and CMP were completely normal.  She denies any previous abdominal operations.  HPI  Past Medical History:  Diagnosis Date  . Gestational diabetes     History reviewed. No pertinent surgical history.  Family History  Problem Relation Age of Onset  . Diabetes Mother     Social History Social History   Tobacco Use  . Smoking status: Never Smoker  . Smokeless tobacco: Never Used  Substance Use Topics  . Alcohol use: No    Alcohol/week: 0.0 standard drinks  . Drug use: No    No Known Allergies  Current Outpatient Medications  Medication Sig Dispense Refill  . benzocaine-Menthol (DERMOPLAST) 20-0.5 % AERO Apply 1 application topically as needed for irritation (perineal discomfort). 56 g 0  . ibuprofen (ADVIL,MOTRIN) 600 MG tablet Take 1 tablet (600 mg total) by mouth every 6 (six) hours. 30 tablet 0  . ondansetron (ZOFRAN ODT) 4 MG disintegrating tablet Take 1 tablet (4 mg total) by mouth every 8 (eight) hours as needed for nausea or vomiting. 12 tablet 0   No current facility-administered medications for this visit.      Review of Systems Full ROS  was asked and was negative except for the  information on the HPI  Physical Exam Blood pressure 120/81, pulse 77, temperature 98.6 F (37 C), temperature source Temporal, height 4\' 11"  (1.499 m), weight 162 lb (73.5 kg), SpO2 98 %, unknown if currently breastfeeding. CONSTITUTIONAL: NAD EYES: Pupils are equal, round, and reactive to light, Sclera are non-icteric. EARS, NOSE, MOUTH AND THROAT: The oropharynx is clear. The oral mucosa is pink and moist. Hearing is intact to voice. LYMPH NODES:  Lymph nodes in the neck are normal. RESPIRATORY:  Lungs are clear. There is normal respiratory effort, with equal breath sounds bilaterally, and without pathologic use of accessory muscles. CARDIOVASCULAR: Heart is regular without murmurs, gallops, or rubs. GI: The abdomen is soft, mild TTP RUQ, no peritonitis or Murphy, and nondistended. There are no palpable masses. There is no hepatosplenomegaly. There are normal bowel sounds in all quadrants. GU: Rectal deferred.   MUSCULOSKELETAL: Normal muscle strength and tone. No cyanosis or edema.   SKIN: Turgor is good and there are no pathologic skin lesions or ulcers. NEUROLOGIC: Motor and sensation is grossly normal. Cranial nerves are grossly intact. PSYCH:  Oriented to person, place and time. Affect is normal.  Data Reviewed  I have personally reviewed the patient's imaging, laboratory findings and medical records.    Assessment/Plan 33 year old female with classic signs and symptoms consistent with biliary colic caused by gallstones.  Discussed with the patient in detail about her disease process.  I do recommend  cholecystectomy.The risks, benefits, complications, treatment options, and expected outcomes were discussed with the patient. The possibilities of bleeding, recurrent infection, finding a normal gallbladder, perforation of viscus organs, damage to surrounding structures, bile leak, abscess formation, needing a drain placed, the need for additional procedures, reaction to medication,  pulmonary aspiration,  failure to diagnose a condition, the possible need to convert to an open procedure, and creating a complication requiring transfusion or operation were discussed with the patient. The patient and/or family concurred with the proposed plan, giving informed consent.  I do think that she will be a good candidate for robotic approach  A copy of the report was sent to the referring provider  Rhonda Big, MD FACS General Surgeon 10/14/2018, 11:16 AM

## 2018-10-18 ENCOUNTER — Telehealth: Payer: Self-pay | Admitting: Surgery

## 2018-10-18 NOTE — Telephone Encounter (Signed)
Pt has been advised of pre admission date/time, Covid Testing date and Surgery date.  Surgery Date: 10/31/18 with Dr Kris Mouton assisted cholecystectomy.  Preadmission Testing Date: 10/23/18 @ 10:15am-office interview.  Covid Testing Date: 10/28/18 between 8-10:30am - patient advised to go to the [facility]  [employer] Video sent via [employer] [facility] and [employer].  Patient has been made aware to call [phone], between 1-3:00pm the day before surgery, to find out what time to arrive.

## 2018-10-23 ENCOUNTER — Other Ambulatory Visit: Payer: Self-pay

## 2018-10-23 ENCOUNTER — Encounter
Admission: RE | Admit: 2018-10-23 | Discharge: 2018-10-23 | Disposition: A | Discharge status: Home / Self Care | Payer: MEDICAID | Source: Ambulatory Visit | Attending: Surgery | Admitting: Surgery

## 2018-10-23 DIAGNOSIS — Z01812 Encounter for preprocedural laboratory examination: Secondary | ICD-10-CM | POA: Insufficient documentation

## 2018-10-23 NOTE — Patient Instructions (Addendum)
Your procedure is scheduled on: Thursday 10/31/18 Su procedimiento est programado para: Report to [facility] a: To find out your arrival time please call [phone] between 1PM - 3PM on Wednesday 10/30/18. Para saber su hora de llegada por favor llame al [phone] entre la 1PM - 3PM el da:  Remember: Instructions that are not followed completely may result in serious medical risk, up to and including death, or upon the discretion of your surgeon and anesthesiologist your surgery may need to be rescheduled.  Recuerde: Las instrucciones que no se siguen completamente Armed forces logistics/support/administrative officer en un riesgo de salud grave, incluyendo hasta la [place] o a discrecin de su cirujano y Scientific laboratory technician, su ciruga se puede posponer.   __X__ 1. Do not eat food or drink liquids after midnight. No gum chewing or hard candies.  No coma alimentos ni tome lquidos despus de la medianoche.  No mastique chicle ni caramelos  duros.     __X__ 2. No alcohol for 24 hours before or after surgery.    No tome alcohol durante las 24 horas antes ni despus de la [place].   ____ 3. Bring all medications with you on the day of surgery if instructed.    Lleve todos los medicamentos con usted el da de su ciruga si se le ha indicado as.   __X__ 4. Notify your doctor if there is any change in your medical condition (cold, fever,                             infections).    Informe a su mdico si hay algn cambio en su condicin mdica (resfriado, fiebre, infecciones).   Do not wear jewelry, make-up, hairpins, clips or nail polish.  No use joyas, maquillajes, pinzas/ganchos para el cabello ni esmalte de uas.  Do not wear lotions, powders, or perfumes. .  No use lociones, polvos o perfumes.  .    Do not shave 48 hours prior to surgery. Men may shave face and neck.  No se afeite 48 horas antes de la [place].  Los hombres pueden [employer] cara y el cuello.   Do not bring valuables to the  hospital.   No lleve objetos de valor al hospital.  [facility] is not responsible for any belongings or valuables.  [place] no se hace responsable de ningn tipo de pertenencias u objetos de Licensed conveyancer.               Contacts, dentures or bridgework may not be worn into surgery.  Los lentes de [place], las dentaduras postizas o puentes no se pueden usar en la [place].  Leave your suitcase in the car. After surgery it may be brought to your room.  Deje su maleta en el auto.  Despus de la ciruga podr traerla a su habitacin.  For patients admitted to the hospital, discharge time is determined by your treatment team.  Para los pacientes que sean ingresados al hospital, el tiempo en el cual se le dar de alta es determinado por su                equipo de [place].   Patients discharged the day of surgery will not be allowed to drive home. A los pacientes que se les da de alta el mismo da de la ciruga no se les permitir conducir a Higher education careers adviser.   Please read over the following fact sheets that you were given: Por  favor lea las siguientes hojas de informacin que le dieron:     ____ Take these medicines the morning of surgery with A SIP OF WATER:          Tome estas medicinas la maana de la ciruga con [place]:  1.   2.   3.   4.       5.  6.  ____ Fleet Enema (as directed)          Enema de Fleet (segn lo indicado)    __x__ Use CHG Soap as directed          Utilice el jabn de CHG segn lo indicado  ____ Use inhalers on the day of surgery          Use los inhaladores el da de la ciruga  ____ Stop metformin 2 days prior to surgery          Deje de tomar el metformin 2 das antes de la ciruga    ____ Take 1/2 of usual insulin dose the night before surgery and none on the morning of surgery           Tome la mitad de la dosis habitual de insulina la noche antes de la [place] y no tome nada en la maana de la             ciruga  ____ Stop Coumadin/Plavix/aspirin  on           Deje de tomar el Coumadin/Plavix/aspirina el da:  __x__ Stop Anti-inflammatories on Today (ibuprofen, advil, aleve, aspirin, motrin)          Deje de tomar antiinflamatorios el da:   ____ Stop supplements until after surgery            Deje de tomar suplementos hasta despus de la ciruga  ____ Bring C-Pap to the hospital          [facility] al hospital

## 2018-10-24 ENCOUNTER — Other Ambulatory Visit: Payer: Self-pay

## 2018-10-24 ENCOUNTER — Encounter: Payer: Self-pay | Admitting: Emergency Medicine

## 2018-10-24 ENCOUNTER — Emergency Department
Admission: EM | Admit: 2018-10-24 | Discharge: 2018-10-24 | Disposition: A | Discharge status: Home / Self Care | Payer: Self-pay | Attending: Emergency Medicine | Admitting: Emergency Medicine

## 2018-10-24 ENCOUNTER — Emergency Department: Payer: Self-pay

## 2018-10-24 DIAGNOSIS — K8020 Calculus of gallbladder without cholecystitis without obstruction: Secondary | ICD-10-CM | POA: Insufficient documentation

## 2018-10-24 DIAGNOSIS — R1011 Right upper quadrant pain: Secondary | ICD-10-CM

## 2018-10-24 DIAGNOSIS — Z20828 Contact with and (suspected) exposure to other viral communicable diseases: Secondary | ICD-10-CM | POA: Insufficient documentation

## 2018-10-24 LAB — COMPREHENSIVE METABOLIC PANEL
ALT: 31 U/L (ref 0–44)
AST: 22 U/L (ref 15–41)
Albumin: 4.4 g/dL (ref 3.5–5.0)
Alkaline Phosphatase: 70 U/L (ref 38–126)
Anion gap: 10 (ref 5–15)
BUN: 12 mg/dL (ref 6–20)
CO2: 23 mmol/L (ref 22–32)
Calcium: 9.5 mg/dL (ref 8.9–10.3)
Chloride: 105 mmol/L (ref 98–111)
Creatinine, Ser: 0.6 mg/dL (ref 0.44–1.00)
GFR calc Af Amer: >60 mL/min (ref >60)
GFR calc non Af Amer: >60 mL/min (ref >60)
Glucose, Bld: 111 mg/dL — ABNORMAL HIGH (ref 70–99)
Potassium: 3.7 mmol/L (ref 3.5–5.1)
Sodium: 138 mmol/L (ref 135–145)
Total Bilirubin: 0.4 mg/dL (ref 0.3–1.2)
Total Protein: 7.9 g/dL (ref 6.5–8.1)

## 2018-10-24 LAB — CBC WITH DIFFERENTIAL/PLATELET
Abs Immature Granulocytes: 0.03 10*3/uL (ref 0.00–0.07)
Basophils Absolute: 0 10*3/uL (ref 0.0–0.1)
Basophils Relative: 0 %
Eosinophils Absolute: 0.1 10*3/uL (ref 0.0–0.5)
Eosinophils Relative: 1 %
HCT: 36.9 % (ref 36.0–46.0)
Hemoglobin: 12.4 g/dL (ref 12.0–15.0)
Immature Granulocytes: 0 %
Lymphocytes Relative: 43 %
Lymphs Abs: 4.1 10*3/uL — ABNORMAL HIGH (ref 0.7–4.0)
MCH: 30.5 pg (ref 26.0–34.0)
MCHC: 33.6 g/dL (ref 30.0–36.0)
MCV: 90.9 fL (ref 80.0–100.0)
Monocytes Absolute: 0.9 10*3/uL (ref 0.1–1.0)
Monocytes Relative: 9 %
Neutro Abs: 4.3 10*3/uL (ref 1.7–7.7)
Neutrophils Relative %: 47 %
Platelets: 349 10*3/uL (ref 150–400)
RBC: 4.06 MIL/uL (ref 3.87–5.11)
RDW: 12.7 % (ref 11.5–15.5)
WBC: 9.5 10*3/uL (ref 4.0–10.5)
nRBC: 0 % (ref 0.0–0.2)

## 2018-10-24 LAB — URINALYSIS, COMPLETE (UACMP) WITH MICROSCOPIC
Bilirubin Urine: NEGATIVE
Glucose, UA: NEGATIVE mg/dL
Hgb urine dipstick: NEGATIVE
Ketones, ur: NEGATIVE mg/dL
Leukocytes,Ua: NEGATIVE
Nitrite: NEGATIVE
Protein, ur: NEGATIVE mg/dL
Specific Gravity, Urine: 1.004 — ABNORMAL LOW (ref 1.005–1.030)
pH: 7 (ref 5.0–8.0)

## 2018-10-24 LAB — SARS CORONAVIRUS 2 BY RT PCR (HOSPITAL ORDER, PERFORMED IN ~~LOC~~ HOSPITAL LAB): SARS Coronavirus 2: NEGATIVE

## 2018-10-24 LAB — POCT PREGNANCY, URINE: Preg Test, Ur: NEGATIVE

## 2018-10-24 LAB — LIPASE, BLOOD: Lipase: 21 U/L (ref 11–51)

## 2018-10-24 MED ORDER — SODIUM CHLORIDE 0.9 % IV BOLUS
1000.0000 mL | Freq: Once | INTRAVENOUS | Status: AC
  Administered 2018-10-24: 03:00:00 1000 mL via INTRAVENOUS

## 2018-10-24 MED ORDER — KETOROLAC TROMETHAMINE 30 MG/ML IJ SOLN
30.0000 mg | Freq: Once | INTRAMUSCULAR | Status: AC
  Administered 2018-10-24: 30 mg via INTRAVENOUS
  Filled 2018-10-24: qty 1

## 2018-10-24 MED ORDER — ONDANSETRON HCL 4 MG/2ML IJ SOLN
4.0000 mg | Freq: Once | INTRAMUSCULAR | Status: AC
  Administered 2018-10-24: 4 mg via INTRAVENOUS
  Filled 2018-10-24: qty 2

## 2018-10-24 MED ORDER — MORPHINE SULFATE (PF) 4 MG/ML IV SOLN
4.0000 mg | Freq: Once | INTRAVENOUS | Status: AC
  Administered 2018-10-24: 03:00:00 4 mg via INTRAVENOUS
  Filled 2018-10-24: qty 1

## 2018-10-24 MED ORDER — OXYCODONE-ACETAMINOPHEN 5-325 MG PO TABS: 1.0000 | ORAL_TABLET | ORAL | 0 refills | Status: DC | PRN

## 2018-10-24 MED ORDER — ONDANSETRON 4 MG PO TBDP: 4.0000 mg | ORAL_TABLET | Freq: Three times a day (TID) | ORAL | 0 refills | Status: DC | PRN

## 2018-10-24 NOTE — ED Provider Notes (Signed)
[facility] Emergency Department Provider Note  ____________________________________________   First MD Initiated Contact with Patient 10/24/18 0255     (approximate)  I have reviewed the triage vital signs and the nursing notes.   HISTORY  Chief Complaint Abdominal Pain    HPI Rhonda Kramer is a 33 y.o. female with history of cholelithiasis with plan for cholecystectomy on 10/31/2018 presents to the emergency department secondary to 10 out of 10 right upper quadrant pain with associated nausea and vomiting that began at 1 AM this morning.  Patient denies any fever afebrile on presentation.      Past Medical History:  Diagnosis Date   Gestational diabetes     Patient Active Problem List   Diagnosis Date Noted   Normal labor and delivery 07/12/2015   Irregular uterine contractions 07/10/2015    History reviewed. No pertinent surgical history.  Prior to Admission medications   Medication Sig Start Date End Date Taking? Authorizing Provider  ibuprofen (ADVIL) 200 MG tablet Take 200 mg by mouth as needed.    [provider]  ondansetron (ZOFRAN ODT) 4 MG disintegrating tablet Take 1 tablet (4 mg total) by mouth every 8 (eight) hours as needed for nausea or vomiting. 10/02/18   Chesley NoonJessup, Charles, MD  oxyCODONE-acetaminophen (PERCOCET/ROXICET) 5-325 MG tablet Take 1 tablet by mouth every 4 (four) hours as needed for moderate pain or severe pain.    [provider]    Allergies Patient has no known allergies.  Family History  Problem Relation Age of Onset   Diabetes Mother     Social History Social History   Tobacco Use   Smoking status: Never Smoker   Smokeless tobacco: Never Used  Substance Use Topics   Alcohol use: No    Alcohol/week: 0.0 standard drinks   Drug use: No    Review of Systems Constitutional: No fever/chills Eyes: No visual changes. ENT: No sore throat. Cardiovascular: Denies chest  pain. Respiratory: Denies shortness of breath. Gastrointestinal: Positive for abdominal pain.  Positive for Nausea and vomiting.  No diarrhea.  No constipation. Genitourinary: Negative for dysuria. Musculoskeletal: Negative for neck pain.  Negative for back pain. Integumentary: Negative for rash. Neurological: Negative for headaches, focal weakness or numbness.   ____________________________________________   PHYSICAL EXAM:  VITAL SIGNS: ED Triage Vitals  Enc Vitals Group     BP 10/24/18 0306 (!) 142/91     Pulse Rate 10/24/18 0306 77     Resp 10/24/18 0306 20     Temp 10/24/18 0306 98.1 F (36.7 C)     Temp Source 10/24/18 0306 Oral     SpO2 10/24/18 0306 100 %     Weight 10/24/18 0249 73 kg (160 lb 15 oz)     Height 10/24/18 0249 1.499 m (4\' 11" )     Head Circumference --      Peak Flow --      Pain Score 10/24/18 0249 10     Pain Loc --      Pain Edu? --      Excl. in GC? --     Constitutional: Alert and oriented.  Apparent discomfort Eyes: Conjunctivae are normal.  Mouth/Throat: Mucous membranes are moist. Neck: No stridor.  No meningeal signs.   Cardiovascular: Normal rate, regular rhythm. Good peripheral circulation. Grossly normal heart sounds. Respiratory: Normal respiratory effort.  No retractions. Gastrointestinal: Right upper quadrant tenderness to palpation.. No distention.  Musculoskeletal: No lower extremity tenderness nor edema. No gross deformities  of extremities. Neurologic:  Normal speech and language. No gross focal neurologic deficits are appreciated.  Skin:  Skin is warm, dry and intact. Psychiatric: Mood and affect are normal. Speech and behavior are normal.  ____________________________________________   LABS (all labs ordered are listed, but only abnormal results are displayed)  Labs Reviewed  CBC WITH DIFFERENTIAL/PLATELET - Abnormal; Notable for the following components:      Result Value   Lymphs Abs 4.1 (*)    All other components  within normal limits  COMPREHENSIVE METABOLIC PANEL - Abnormal; Notable for the following components:   Glucose, Bld 111 (*)    All other components within normal limits  SARS CORONAVIRUS 2 (HOSPITAL ORDER, [place] [facility])  LIPASE, BLOOD  URINALYSIS, COMPLETE (UACMP) WITH MICROSCOPIC  POCT PREGNANCY, URINE   __  RADIOLOGY I, [place], personally viewed and evaluated these images (plain radiographs) as part of my medical decision making, as well as reviewing the written report by the radiologist.  ED MD interpretation: Cholelithiasis with stone fixed at the gallbladder neck no signs of acute cholecystitis.  Official radiology report(s): US Abdomen Limited Ruq  Result Date: 10/24/2018 CLINICAL DATA:  Right upper quadrant pain with nausea and vomiting EXAM: ULTRASOUND ABDOMEN LIMITED RIGHT UPPER QUADRANT COMPARISON:  10/02/2018 FINDINGS: Gallbladder: Gallstones measuring up to 2 cm, including stone fixed at the neck. No gallbladder wall thickening or focal tenderness. No pericholecystic edema when accounting for fat sparing Common bile duct: Diameter: 4 mm. The distal common bile duct was not visible due to bowel gas Liver: Echogenic liver with sparing at the gallbladder fossa. Portal vein is patent on color Doppler imaging with normal direction of blood flow towards the liver. IMPRESSION: 1. Cholelithiasis with stone fixed at the gallbladder neck. No signs of acute cholecystitis. 2. Hepatic steatosis. Electronically Signed   By: Monte Fantasia M.D.   On: 10/24/2018 04:04      Procedures   ____________________________________________   INITIAL IMPRESSION / MDM / ASSESSMENT AND PLAN / ED COURSE  As part of my medical decision making, I reviewed the following data within the electronic MEDICAL RECORD NUMBER   33 year old female presenting with above-stated history and physical exam consistent with cholelithiasis versus cholecystitis.  Patient given IV morphine  2 mg and subsequently Toradol 30 mg with current pain score 1 out of 10.  Ultrasound revealed cholelithiasis without any evidence of cholecystitis.  Patient discussed with Dr. Hampton Abbot who recommended outpatient pain control while patient awaits cholecystectomy.  ____________________________________________  FINAL CLINICAL IMPRESSION(S) / ED DIAGNOSES  Final diagnoses:  RUQ pain  Calculus of gallbladder without cholecystitis without obstruction     MEDICATIONS GIVEN DURING THIS VISIT:  Medications  morphine 4 MG/ML injection 4 mg (4 mg Intravenous Given 10/24/18 0258)  ondansetron (ZOFRAN) injection 4 mg (4 mg Intravenous Given 10/24/18 0258)  sodium chloride 0.9 % bolus 1,000 mL (0 mLs Intravenous Stopped 10/24/18 0416)  ketorolac (TORADOL) 30 MG/ML injection 30 mg (30 mg Intravenous Given 10/24/18 0427)     ED Discharge Orders    None      *Please note:  Aishani Kalis Kramer was evaluated in Emergency Department on 10/24/2018 for the symptoms described in the history of present illness. She was evaluated in the context of the global COVID-19 pandemic, which necessitated consideration that the patient might be at risk for infection with the SARS-CoV-2 virus that causes COVID-19. Institutional protocols and algorithms that pertain to the evaluation of patients at risk for  COVID-19 are in a state of rapid change based on information released by regulatory bodies including the CDC and federal and state organizations. These policies and algorithms were followed during the patient's care in the ED.  Some ED evaluations and interventions may be delayed as a result of limited staffing during the pandemic.*  Note:  This document was prepared using Dragon voice recognition software and may include unintentional dictation errors.   Darci Current, MD 10/24/18 0530

## 2018-10-24 NOTE — ED Triage Notes (Signed)
Pt to triage via w/c, mask in place, tearful, appears uncomfortable; c/o rt upper abd pain accomp by N/V since 1am; sched for cholecystectomy 10/15

## 2018-10-28 ENCOUNTER — Other Ambulatory Visit
Admission: RE | Admit: 2018-10-28 | Discharge: 2018-10-28 | Disposition: A | Discharge status: Home / Self Care | Payer: Self-pay | Source: Ambulatory Visit | Attending: Surgery | Admitting: Surgery

## 2018-10-28 DIAGNOSIS — Z20828 Contact with and (suspected) exposure to other viral communicable diseases: Secondary | ICD-10-CM | POA: Insufficient documentation

## 2018-10-28 DIAGNOSIS — Z01812 Encounter for preprocedural laboratory examination: Secondary | ICD-10-CM | POA: Insufficient documentation

## 2018-10-28 LAB — SARS CORONAVIRUS 2 (TAT 6-24 HRS): SARS Coronavirus 2: NEGATIVE

## 2018-10-31 ENCOUNTER — Other Ambulatory Visit: Payer: Self-pay

## 2018-10-31 ENCOUNTER — Encounter: Payer: Self-pay | Admitting: *Deleted

## 2018-10-31 ENCOUNTER — Encounter
Admission: RE | Disposition: A | Discharge status: Home / Self Care | Payer: Self-pay | Source: Home / Self Care | Attending: Surgery

## 2018-10-31 ENCOUNTER — Ambulatory Visit: Payer: Self-pay | Admitting: Anesthesiology

## 2018-10-31 ENCOUNTER — Ambulatory Visit
Admission: RE | Admit: 2018-10-31 | Discharge: 2018-10-31 | Disposition: A | Discharge status: Home / Self Care | Payer: Self-pay | Attending: Surgery | Admitting: Surgery

## 2018-10-31 DIAGNOSIS — K8020 Calculus of gallbladder without cholecystitis without obstruction: Secondary | ICD-10-CM

## 2018-10-31 DIAGNOSIS — K8010 Calculus of gallbladder with chronic cholecystitis without obstruction: Secondary | ICD-10-CM | POA: Insufficient documentation

## 2018-10-31 LAB — POCT PREGNANCY, URINE: Preg Test, Ur: NEGATIVE

## 2018-10-31 SURGERY — CHOLECYSTECTOMY, ROBOT-ASSISTED, LAPAROSCOPIC
Anesthesia: General

## 2018-10-31 MED ORDER — FENTANYL CITRATE (PF) 100 MCG/2ML IJ SOLN
INTRAMUSCULAR | Status: AC
  Filled 2018-10-31: qty 2

## 2018-10-31 MED ORDER — SUGAMMADEX SODIUM 200 MG/2ML IV SOLN
INTRAVENOUS | Status: DC | PRN
  Administered 2018-10-31: 200 mg via INTRAVENOUS

## 2018-10-31 MED ORDER — CEFAZOLIN SODIUM-DEXTROSE 2-4 GM/100ML-% IV SOLN
INTRAVENOUS | Status: AC
  Filled 2018-10-31: qty 100

## 2018-10-31 MED ORDER — ACETAMINOPHEN 500 MG PO TABS
1000.0000 mg | ORAL_TABLET | ORAL | Status: AC
  Administered 2018-10-31: 09:00:00 1000 mg via ORAL

## 2018-10-31 MED ORDER — CEFAZOLIN SODIUM-DEXTROSE 2-4 GM/100ML-% IV SOLN
2.0000 g | INTRAVENOUS | Status: AC
  Administered 2018-10-31: 2 g via INTRAVENOUS

## 2018-10-31 MED ORDER — HYDROCODONE-ACETAMINOPHEN 5-325 MG PO TABS: 1.0000 | ORAL_TABLET | Freq: Four times a day (QID) | ORAL | 0 refills | Status: DC | PRN

## 2018-10-31 MED ORDER — HYDROMORPHONE HCL 1 MG/ML IJ SOLN
0.2500 mg | INTRAMUSCULAR | Status: DC | PRN
  Administered 2018-10-31 (×3): 0.25 mg via INTRAVENOUS

## 2018-10-31 MED ORDER — SODIUM CHLORIDE FLUSH 0.9 % IV SOLN
INTRAVENOUS | Status: AC
  Filled 2018-10-31: qty 10

## 2018-10-31 MED ORDER — FENTANYL CITRATE (PF) 100 MCG/2ML IJ SOLN
25.0000 ug | INTRAMUSCULAR | Status: AC | PRN
  Administered 2018-10-31 (×6): 25 ug via INTRAVENOUS

## 2018-10-31 MED ORDER — SUGAMMADEX SODIUM 200 MG/2ML IV SOLN
INTRAVENOUS | Status: AC
  Filled 2018-10-31: qty 4

## 2018-10-31 MED ORDER — ACETAMINOPHEN 500 MG PO TABS
ORAL_TABLET | ORAL | Status: AC
  Administered 2018-10-31: 1000 mg via ORAL
  Filled 2018-10-31: qty 2

## 2018-10-31 MED ORDER — ALPRAZOLAM 0.5 MG PO TABS
ORAL_TABLET | ORAL | Status: AC
  Filled 2018-10-31: qty 1

## 2018-10-31 MED ORDER — PHENYLEPHRINE HCL (PRESSORS) 10 MG/ML IV SOLN
INTRAVENOUS | Status: DC | PRN
  Administered 2018-10-31 (×2): 100 ug via INTRAVENOUS
  Administered 2018-10-31: 200 ug via INTRAVENOUS

## 2018-10-31 MED ORDER — SUGAMMADEX SODIUM 500 MG/5ML IV SOLN
INTRAVENOUS | Status: AC
  Filled 2018-10-31: qty 5

## 2018-10-31 MED ORDER — CHLORHEXIDINE GLUCONATE CLOTH 2 % EX PADS: 6.0000 | MEDICATED_PAD | Freq: Once | CUTANEOUS | Status: DC

## 2018-10-31 MED ORDER — BUPIVACAINE-EPINEPHRINE (PF) 0.5% -1:200000 IJ SOLN
INTRAMUSCULAR | Status: AC
  Filled 2018-10-31: qty 30

## 2018-10-31 MED ORDER — ONDANSETRON HCL 4 MG/2ML IJ SOLN
4.0000 mg | Freq: Once | INTRAMUSCULAR | Status: AC | PRN
  Administered 2018-10-31: 4 mg via INTRAVENOUS

## 2018-10-31 MED ORDER — ROCURONIUM BROMIDE 100 MG/10ML IV SOLN
INTRAVENOUS | Status: DC | PRN
  Administered 2018-10-31: 10 mg via INTRAVENOUS
  Administered 2018-10-31: 40 mg via INTRAVENOUS

## 2018-10-31 MED ORDER — ALPRAZOLAM 0.5 MG PO TABS
0.5000 mg | ORAL_TABLET | Freq: Once | ORAL | Status: AC
  Administered 2018-10-31: 13:00:00 0.5 mg via ORAL

## 2018-10-31 MED ORDER — LIDOCAINE HCL (CARDIAC) PF 100 MG/5ML IV SOSY
PREFILLED_SYRINGE | INTRAVENOUS | Status: DC | PRN
  Administered 2018-10-31: 100 mg via INTRAVENOUS

## 2018-10-31 MED ORDER — MIDAZOLAM HCL 2 MG/2ML IJ SOLN
INTRAMUSCULAR | Status: DC | PRN
  Administered 2018-10-31: 2 mg via INTRAVENOUS

## 2018-10-31 MED ORDER — PROPOFOL 10 MG/ML IV BOLUS
INTRAVENOUS | Status: AC
  Filled 2018-10-31: qty 40

## 2018-10-31 MED ORDER — FAMOTIDINE 20 MG PO TABS
ORAL_TABLET | ORAL | Status: AC
  Administered 2018-10-31: 20 mg via ORAL
  Filled 2018-10-31: qty 1

## 2018-10-31 MED ORDER — ONDANSETRON HCL 4 MG/2ML IJ SOLN
INTRAMUSCULAR | Status: AC
  Filled 2018-10-31: qty 2

## 2018-10-31 MED ORDER — MIDAZOLAM HCL 2 MG/2ML IJ SOLN
INTRAMUSCULAR | Status: AC
  Filled 2018-10-31: qty 2

## 2018-10-31 MED ORDER — BUPIVACAINE-EPINEPHRINE 0.5% -1:200000 IJ SOLN
INTRAMUSCULAR | Status: DC | PRN
  Administered 2018-10-31: 3 mL

## 2018-10-31 MED ORDER — CELECOXIB 200 MG PO CAPS
ORAL_CAPSULE | ORAL | Status: AC
  Administered 2018-10-31: 09:00:00 200 mg via ORAL
  Filled 2018-10-31: qty 1

## 2018-10-31 MED ORDER — PROPOFOL 10 MG/ML IV BOLUS
INTRAVENOUS | Status: DC | PRN
  Administered 2018-10-31: 200 mg via INTRAVENOUS

## 2018-10-31 MED ORDER — FENTANYL CITRATE (PF) 100 MCG/2ML IJ SOLN
INTRAMUSCULAR | Status: AC
  Administered 2018-10-31: 25 ug via INTRAVENOUS
  Filled 2018-10-31: qty 2

## 2018-10-31 MED ORDER — FENTANYL CITRATE (PF) 100 MCG/2ML IJ SOLN
INTRAMUSCULAR | Status: DC | PRN
  Administered 2018-10-31: 100 ug via INTRAVENOUS
  Administered 2018-10-31 (×2): 50 ug via INTRAVENOUS

## 2018-10-31 MED ORDER — GABAPENTIN 300 MG PO CAPS
ORAL_CAPSULE | ORAL | Status: AC
  Administered 2018-10-31: 300 mg via ORAL
  Filled 2018-10-31: qty 1

## 2018-10-31 MED ORDER — FAMOTIDINE 20 MG PO TABS
20.0000 mg | ORAL_TABLET | Freq: Once | ORAL | Status: AC
  Administered 2018-10-31: 09:00:00 20 mg via ORAL

## 2018-10-31 MED ORDER — LACTATED RINGERS IV SOLN
INTRAVENOUS | Status: DC
  Administered 2018-10-31 (×3): via INTRAVENOUS

## 2018-10-31 MED ORDER — CELECOXIB 200 MG PO CAPS
200.0000 mg | ORAL_CAPSULE | ORAL | Status: AC
  Administered 2018-10-31: 09:00:00 200 mg via ORAL

## 2018-10-31 MED ORDER — GABAPENTIN 300 MG PO CAPS
300.0000 mg | ORAL_CAPSULE | ORAL | Status: AC
  Administered 2018-10-31: 09:00:00 300 mg via ORAL

## 2018-10-31 MED ORDER — DEXAMETHASONE SODIUM PHOSPHATE 10 MG/ML IJ SOLN
INTRAMUSCULAR | Status: DC | PRN
  Administered 2018-10-31: 4 mg via INTRAVENOUS

## 2018-10-31 MED ORDER — HYDROMORPHONE HCL 1 MG/ML IJ SOLN
INTRAMUSCULAR | Status: AC
  Administered 2018-10-31: 0.25 mg via INTRAVENOUS
  Filled 2018-10-31: qty 1

## 2018-10-31 SURGICAL SUPPLY — 42 items
CANISTER SUCT 1200ML W/VALVE (MISCELLANEOUS) ×3 IMPLANT
CHLORAPREP W/TINT 26 (MISCELLANEOUS) ×3 IMPLANT
CLIP VESOLOCK MED LG 6/CT (CLIP) ×3 IMPLANT
COVER WAND RF STERILE (DRAPES) ×3 IMPLANT
DECANTER SPIKE VIAL GLASS SM (MISCELLANEOUS) ×3 IMPLANT
DEFOGGER SCOPE WARMER CLEARIFY (MISCELLANEOUS) ×3 IMPLANT
DERMABOND ADVANCED (GAUZE/BANDAGES/DRESSINGS) ×2
DERMABOND ADVANCED .7 DNX12 (GAUZE/BANDAGES/DRESSINGS) ×1 IMPLANT
DRAPE 3/4 80X56 (DRAPES) ×3 IMPLANT
DRAPE ARM DVNC X/XI (DISPOSABLE) ×4 IMPLANT
DRAPE COLUMN DVNC XI (DISPOSABLE) ×1 IMPLANT
DRAPE DA VINCI XI ARM (DISPOSABLE) ×8
DRAPE DA VINCI XI COLUMN (DISPOSABLE) ×2
ELECT CAUTERY BLADE 6.4 (BLADE) ×3 IMPLANT
ELECT REM PT RETURN 9FT ADLT (ELECTROSURGICAL) ×3
ELECTRODE REM PT RTRN 9FT ADLT (ELECTROSURGICAL) ×1 IMPLANT
GLOVE BIO SURGEON STRL SZ7 (GLOVE) ×6 IMPLANT
GOWN STRL REUS W/ TWL LRG LVL3 (GOWN DISPOSABLE) ×4 IMPLANT
GOWN STRL REUS W/TWL LRG LVL3 (GOWN DISPOSABLE) ×8
IRRIGATION STRYKERFLOW (MISCELLANEOUS) IMPLANT
IRRIGATOR STRYKERFLOW (MISCELLANEOUS)
IV NS 1000ML (IV SOLUTION)
IV NS 1000ML BAXH (IV SOLUTION) IMPLANT
KIT PINK PAD W/HEAD ARE REST (MISCELLANEOUS) ×3
KIT PINK PAD W/HEAD ARM REST (MISCELLANEOUS) ×1 IMPLANT
LABEL OR SOLS (LABEL) ×3 IMPLANT
NEEDLE HYPO 22GX1.5 SAFETY (NEEDLE) ×3 IMPLANT
NS IRRIG 500ML POUR BTL (IV SOLUTION) ×3 IMPLANT
OBTURATOR OPTICAL STANDARD 8MM (TROCAR) ×2
OBTURATOR OPTICAL STND 8 DVNC (TROCAR) ×1
OBTURATOR OPTICALSTD 8 DVNC (TROCAR) ×1 IMPLANT
PACK LAP CHOLECYSTECTOMY (MISCELLANEOUS) ×3 IMPLANT
PENCIL ELECTRO HAND CTR (MISCELLANEOUS) ×3 IMPLANT
POUCH SPECIMEN RETRIEVAL 10MM (ENDOMECHANICALS) ×3 IMPLANT
SEAL CANN UNIV 5-8 DVNC XI (MISCELLANEOUS) ×4 IMPLANT
SEAL XI 5MM-8MM UNIVERSAL (MISCELLANEOUS) ×8
SOLUTION ELECTROLUBE (MISCELLANEOUS) ×3 IMPLANT
SPONGE LAP 18X18 RF (DISPOSABLE) ×3 IMPLANT
SUT MNCRL AB 4-0 PS2 18 (SUTURE) ×3 IMPLANT
SUT VICRYL 0 AB UR-6 (SUTURE) ×6 IMPLANT
TROCAR 130MM GELPORT  DAV (MISCELLANEOUS) ×3 IMPLANT
TUBING EVAC SMOKE HEATED PNEUM (TUBING) ×3 IMPLANT

## 2018-10-31 NOTE — Anesthesia Post-op Follow-up Note (Signed)
Anesthesia QCDR form completed.        

## 2018-10-31 NOTE — Anesthesia Procedure Notes (Signed)
Procedure Name: Intubation Date/Time: 10/31/2018 10:22 AM Performed by: Leander Rams, CRNA Pre-anesthesia Checklist: Patient identified, Emergency Drugs available, Suction available, Patient being monitored and Timeout performed Patient Re-evaluated:Patient Re-evaluated prior to induction Oxygen Delivery Method: Circle system utilized Preoxygenation: Pre-oxygenation with 100% oxygen Induction Type: IV induction Ventilation: Mask ventilation without difficulty Laryngoscope Size: McGraph and 4 Grade View: Grade I Tube type: Oral Tube size: 7.0 mm Airway Equipment and Method: Stylet and Video-laryngoscopy Placement Confirmation: ETT inserted through vocal cords under direct vision Secured at: 19 cm Tube secured with: Tape Dental Injury: Teeth and Oropharynx as per pre-operative assessment

## 2018-10-31 NOTE — Op Note (Signed)
Robotic assisted laparoscopic Cholecystectomy  Pre-operative Diagnosis: biliary colic  Post-operative Diagnosis: same  Procedure:  Robotic assisted laparoscopic Cholecystectomy  Surgeon: Caroleen Hamman, MD FACS  Anesthesia: Gen. with endotracheal tube  Findings: Chronic mild Cholecystitis   Estimated Blood Loss: 5cc       Specimens: Gallbladder           Complications: none   Procedure Details  The patient was seen again in the Holding Room. The benefits, complications, treatment options, and expected outcomes were discussed with the patient. The risks of bleeding, infection, recurrence of symptoms, failure to resolve symptoms, bile duct damage, bile duct leak, retained common bile duct stone, bowel injury, any of which could require further surgery and/or ERCP, stent, or papillotomy were reviewed with the patient. The likelihood of improving the patient's symptoms with return to their baseline status is good.  The patient and/or family concurred with the proposed plan, giving informed consent.  The patient was taken to Operating Room, identified  and the procedure verified as Laparoscopic Cholecystectomy.  A Time Out was held and the above information confirmed.  Prior to the induction of general anesthesia, antibiotic prophylaxis was administered. VTE prophylaxis was in place. General endotracheal anesthesia was then administered and tolerated well. After the induction, the abdomen was prepped with Chloraprep and draped in the sterile fashion. The patient was positioned in the supine position.  Cut down technique was used to enter the abdominal cavity and a Hasson trochar was placed after two vicryl stitches were anchored to the fascia. Pneumoperitoneum was then created with CO2 and tolerated well without any adverse changes in the patient's vital signs.  Three 8-mm ports were placed under direct vision. All skin incisions  were infiltrated with a local anesthetic agent before making the  incision and placing the trocars.   The patient was positioned  in reverse Trendelenburg, robot was brought to the surgical field and docked in the standard fashion.  We made sure all the instrumentation was kept indirect view at all times and that there were no collision between the arms. I scrubbed out and went to the console.  The gallbladder was identified, the fundus grasped and retracted cephalad. Adhesions were lysed bluntly. The infundibulum was grasped and retracted laterally, exposing the peritoneum overlying the triangle of Calot. This was then divided and exposed in a blunt fashion. An extended critical view of the cystic duct and cystic artery was obtained.  The cystic duct was clearly identified and bluntly dissected.   Artery and duct were double clipped and divided.  The gallbladder was taken from the gallbladder fossa in a retrograde fashion with the electrocautery.  Hemostasis was achieved with the electrocautery. nspection of the right upper quadrant was performed. No bleeding, bile duct injury or leak, or bowel injury was noted. Robotic instruments and robotic arms were undocked in the standard fashion.  I scrubbed back in.  The gallbladder was removed and placed in an Endocatch bag.   Pneumoperitoneum was released.  The periumbilical port site was closed with interrumpted 0 Vicryl sutures. 4-0 subcuticular Monocryl was used to close the skin. Dermabond was  applied.  The patient was then extubated and brought to the recovery room in stable condition. Sponge, lap, and needle counts were correct at closure and at the conclusion of the case.               Caroleen Hamman, MD, FACS

## 2018-10-31 NOTE — Anesthesia Postprocedure Evaluation (Signed)
Anesthesia Post Note  Patient: Rhonda Kramer  Procedure(s) Performed: XI ROBOTIC ASSISTED LAPAROSCOPIC CHOLECYSTECTOMY (N/A )  Patient location during evaluation: PACU Anesthesia Type: General Level of consciousness: awake and alert Pain management: pain level controlled Vital Signs Assessment: post-procedure vital signs reviewed and stable Respiratory status: spontaneous breathing and respiratory function stable Cardiovascular status: stable Anesthetic complications: no     Last Vitals:  Vitals:   10/31/18 0859 10/31/18 1132  BP: 124/82 139/84  Pulse: 77 87  Resp: 20 (!) 31  Temp: 36.6 C (!) 36.3 C  SpO2: 100% 100%    Last Pain:  Vitals:   10/31/18 1143  TempSrc:   PainSc: 10-Worst pain ever                 Carl Bleecker K

## 2018-10-31 NOTE — Discharge Instructions (Addendum)
AMBULATORY SURGERY  DISCHARGE INSTRUCTIONS   1) The drugs that you were given will stay in your system until tomorrow so for the next 24 hours you should not:  A) Drive an automobile B) Make any legal decisions C) Drink any alcoholic beverage   2) You may resume regular meals tomorrow.  Today it is better to start with liquids and gradually work up to solid foods.  You may eat anything you prefer, but it is better to start with liquids, then soup and crackers, and gradually work up to solid foods.   3) Please notify your doctor immediately if you have any unusual bleeding, trouble breathing, redness and pain at the surgery site, drainage, fever, or pain not relieved by medication.    4) Additional Instructions:        Please contact your physician with any problems or Same Day Surgery at [phone], Monday through Friday 6 am to 4 pm, or [place] at [facility] number at [phone].Laparoscopic Cholecystectomy, Care After   These instructions give you information on caring for yourself after your procedure. Your doctor may also give you more specific instructions. Call your doctor if you have any problems or questions after your procedure.  HOME CARE  Change your bandages (dressings) as told by your doctor.  Keep the wound dry and clean. Wash the wound gently with soap and water. Pat the wound dry with a clean towel.  Do not take baths, swim, or use hot tubs for 2 weeks, or as told by your doctor.  Only take medicine as told by your doctor.  Eat a normal diet as told by your doctor.  Do not lift anything heavier than 10 pounds (4.5 kg) until your doctor says it is okay.  Do not play contact sports for 1 week, or as told by your doctor. GET HELP IF:  Your wound is red, puffy (swollen), or painful.  You have yellowish-white fluid (pus) coming from the wound.  You have fluid draining from the wound for more than 1 day.  You have a bad smell coming from the wound.    Your wound breaks open. GET HELP RIGHT AWAY IF:  You have trouble breathing.  You have chest pain.  You have a fever >101  You have pain in the shoulders (shoulder strap areas) that is getting worse.  You feel dizzy or pass out (faint).  You have severe belly (abdominal) pain.  You feel sick to your stomach (nauseous) or throw up (vomit) for more than 1 day.      CIRUGIA AMBULATORIA       Instruccionnes de alta    Date Toma Copier) 15/10/2018   1.  Las drogas que se Statistician en su cuerpo [employer], asi            que por las proximas 24 horas usted no debe:   Conducir Scientist, research (medical)) un automovil   Hacer ninguna decision legal   Tomar ninguna bebida alcoholica  2.  A) [place] puede comenzar una dieta regular.  Es mejor que hoy empiece con           liquidos y gradualmente anada comidas solidas.       B) Puede comer cualquier comida que desee pero es mejor empezar con liquidos,                      luego sopitas con galletas saladas y gradualmente llegar a las comidas solidas.  3.  Por favor avise a su medico inmediatamente si usted tiene algun sangrado anormal,       tiene dificultad con la respiracion, enrojecimiento y Engineer, mining en el sitio de la [place], [place],       fiebro o dolor que se alivia con [place].  4.  A) Su visita posoperatoria (despues de su operacion) es con el  Dr. Everlene Farrier  Date 28/10/2018                   Time 9:30 AM       5.  Istrucciones especificas : Tu cita va a ser por llamada. No tienes que venir a Social research officer, government.

## 2018-10-31 NOTE — Transfer of Care (Signed)
Immediate Anesthesia Transfer of Care Note  Patient: Rhonda Kramer  Procedure(s) Performed: XI ROBOTIC ASSISTED LAPAROSCOPIC CHOLECYSTECTOMY (N/A )  Patient Location: PACU  Anesthesia Type:General  Level of Consciousness: awake  Airway & Oxygen Therapy: Patient Spontanous Breathing  Post-op Assessment: Report given to RN  Post vital signs: stable  Last Vitals:  Vitals Value Taken Time  BP 139/84 10/31/18 1132  Temp    Pulse 82 10/31/18 1135  Resp 24 10/31/18 1135  SpO2 100 % 10/31/18 1135  Vitals shown include unvalidated device data.  Last Pain:  Vitals:   10/31/18 0859  TempSrc: Tympanic  PainSc: 2       Patients Stated Pain Goal: 0 (86/16/83 7290)  Complications: No apparent anesthesia complications

## 2018-10-31 NOTE — Progress Notes (Signed)
Dr. Dahlia Byes at bedside. Pt states she is anxious. Dr. Dahlia Byes gave verbal orders.

## 2018-10-31 NOTE — Interval H&P Note (Signed)
History and Physical Interval Note:  10/31/2018 9:34 AM  Rhonda Kramer  has presented today for surgery, with the diagnosis of Biliary Colic.  The various methods of treatment have been discussed with the patient and family. After consideration of risks, benefits and other options for treatment, the patient has consented to  Procedure(s): XI ROBOTIC [place] (N/A) as a surgical intervention.  The patient's history has been reviewed, patient examined, no change in status, stable for surgery.  I have reviewed the patient's chart and labs.  Questions were answered to the patient's satisfaction.     [place]

## 2018-10-31 NOTE — Anesthesia Preprocedure Evaluation (Addendum)
Anesthesia Evaluation  Patient identified by MRN, date of birth, ID band Patient awake    Reviewed: Allergy & Precautions, NPO status , Patient's Chart, lab work & pertinent test results  History of Anesthesia Complications Negative for: history of anesthetic complications  Airway Mallampati: III       Dental   Pulmonary neg sleep apnea, neg COPD, Not current smoker,           Cardiovascular (-) hypertension(-) Past MI and (-) CHF (-) dysrhythmias (-) Valvular Problems/Murmurs     Neuro/Psych neg Seizures    GI/Hepatic Neg liver ROS, neg GERD  ,  Endo/Other  diabetes, Gestational  Renal/GU negative Renal ROS     Musculoskeletal   Abdominal   Peds  Hematology   Anesthesia Other Findings   Reproductive/Obstetrics                            Anesthesia Physical Anesthesia Plan  ASA: II  Anesthesia Plan: General   Post-op Pain Management:    Induction: Intravenous  PONV Risk Score and Plan: 3 and Dexamethasone, Ondansetron and Midazolam  Airway Management Planned: Oral ETT  Additional Equipment:   Intra-op Plan:   Post-operative Plan:   Informed Consent: I have reviewed the patients History and Physical, chart, labs and discussed the procedure including the risks, benefits and alternatives for the proposed anesthesia with the patient or authorized representative who has indicated his/her understanding and acceptance.       Plan Discussed with:   Anesthesia Plan Comments:         Anesthesia Quick Evaluation

## 2018-11-01 LAB — SURGICAL PATHOLOGY

## 2018-11-05 ENCOUNTER — Telehealth: Payer: Self-pay | Admitting: Surgery

## 2018-11-05 NOTE — Telephone Encounter (Signed)
Laparoscopic Cholecystectomy 10/31/2018 Dr.Pabon.  Patient complains of abdominal pain. Patient complains of constipation. Instructed to try Miralax. Increase water. Denies fever, chills, nausea. Has had chills.  Appetite She can try tylenol and ibuprofen. If no improvement call back and let us know.

## 2018-11-05 NOTE — Telephone Encounter (Signed)
Telephone Triage Questions    Type of surgery?    XI ROBOTIC ASSISTED LAPAROSCOPIC CHOLECYSTECTOMY                    Date?             10/31/2018                 Physician?     Dr. Dahlia Byes   Patient is calling and just has some questions about her surgery she just had. Please call patient and advise.

## 2018-11-13 ENCOUNTER — Other Ambulatory Visit: Payer: Self-pay

## 2018-11-13 ENCOUNTER — Telehealth (INDEPENDENT_AMBULATORY_CARE_PROVIDER_SITE_OTHER): Payer: Self-pay | Admitting: Surgery

## 2018-11-13 DIAGNOSIS — Z09 Encounter for follow-up examination after completed treatment for conditions other than malignant neoplasm: Secondary | ICD-10-CM

## 2018-11-18 NOTE — Progress Notes (Signed)
Telemedicine Surgical Follow Up  11/18/2018  Rhonda Kramer is an 33 y.o. female.   No chief complaint on file.   HPI:  Location of the patient: Home Time spent on call: 5 min Total Time spent in the encounter including counseling and coordination of care: 10 Location of the Provider: office The patient had given consent for a telemedicine visit and they understands the limitations associated with this including but not limited to privacy, cyber security and technology issues. Persons participating in the visit: pt Rhonda Kramer is a 33 year old female status post laparoscopic cholecystectomy doing well.  No fevers no chills some slight tenderness around the umbilicus.  She has been walking and was to go for a hike.    Past Medical History:  Diagnosis Date  . Gestational diabetes     No past surgical history on file.  Family History  Problem Relation Age of Onset  . Diabetes Mother     Social History:  reports that she has never smoked. She has never used smokeless tobacco. She reports that she does not drink alcohol or use drugs.  Allergies:  Allergies  Allergen Reactions  . Oxycodone Itching    Medications reviewed.    ROS Full ROS performed and is otherwise negative other than what is stated in HPI   Assessment/Plan: 33 year old status post lap chole doing well without complications.  Follow-up as needed The pt was provided an opportunity to ask questions and all were answered. The pt was advised to call back or seek in-person evaluation if the symptoms worsen or if the condition fails to improve as anticipated. Greater than 50% of the 10 minutes  visit was spent in counseling/coordination of care   Caroleen Hamman, MD [place] Surgeon

## 2018-11-20 DIAGNOSIS — N879 Dysplasia of cervix uteri, unspecified: Secondary | ICD-10-CM | POA: Insufficient documentation

## 2018-11-23 ENCOUNTER — Other Ambulatory Visit: Payer: Self-pay

## 2018-11-23 DIAGNOSIS — Z20822 Contact with and (suspected) exposure to covid-19: Secondary | ICD-10-CM

## 2018-11-25 ENCOUNTER — Telehealth: Payer: Self-pay

## 2018-11-25 LAB — NOVEL CORONAVIRUS, NAA: SARS-CoV-2, NAA: NOT DETECTED

## 2018-11-25 NOTE — Telephone Encounter (Signed)
Negative COVID results given. Patient results "NOT Detected." Caller expressed understanding. ° °

## 2019-08-11 ENCOUNTER — Ambulatory Visit (LOCAL_COMMUNITY_HEALTH_CENTER): Payer: Self-pay

## 2019-08-11 ENCOUNTER — Other Ambulatory Visit: Payer: Self-pay

## 2019-08-11 VITALS — Ht 60.0 in | Wt 161.0 lb

## 2019-08-11 DIAGNOSIS — Z3009 Encounter for other general counseling and advice on contraception: Secondary | ICD-10-CM

## 2019-08-11 LAB — PREGNANCY, URINE: Preg Test, Ur: NEGATIVE

## 2019-08-11 MED ORDER — ELLA 30 MG PO TABS: 1.0000 | ORAL_TABLET | Freq: Once | ORAL | 0 refills | Status: AC

## 2019-08-11 MED ORDER — ELLA 30 MG PO TABS: 1.0000 | ORAL_TABLET | Freq: Once | ORAL | 0 refills | Status: DC

## 2019-08-11 NOTE — Progress Notes (Signed)
Pt here for Emerg. Contraception. Reports bleeding on 08/01/19 lasting "half hour". Last normal period 06/28/19. Pt notes multiple unprotected sex: 08/10/19, 8 days ago, and >10 days ago. Consult Maximiano Coss, PA-C who advises preg test today. If preg test negative today, may give Samson Frederic. Counsel pt that Samson Frederic is not known to harm fetus if preg.  Counsel pt that based on multiple unprotected sex, pt may be preg and needs to take home preg test in 2 weeks if no normal period. Advise pt to schedule appt to initiate bc method so that emerg. Contraception is not used as primary source of bc. RN carried out provider orders. Preg test negative today. Ella 30 mg po given per Maximiano Coss, PA-C order today. Pt declines to discuss bc options and declines to schedule FP appt.  Questions answered.  Jerel Shepherd, RN

## 2019-08-25 ENCOUNTER — Ambulatory Visit: Payer: Self-pay

## 2019-11-19 ENCOUNTER — Encounter: Payer: Self-pay | Admitting: Physician Assistant

## 2019-11-19 ENCOUNTER — Other Ambulatory Visit: Payer: Self-pay

## 2019-11-19 ENCOUNTER — Ambulatory Visit (LOCAL_COMMUNITY_HEALTH_CENTER): Payer: Self-pay | Admitting: Physician Assistant

## 2019-11-19 VITALS — BP 142/89 | Ht 59.0 in | Wt 165.0 lb

## 2019-11-19 DIAGNOSIS — Z01419 Encounter for gynecological examination (general) (routine) without abnormal findings: Secondary | ICD-10-CM

## 2019-11-19 DIAGNOSIS — Z3009 Encounter for other general counseling and advice on contraception: Secondary | ICD-10-CM

## 2019-11-19 DIAGNOSIS — Z30013 Encounter for initial prescription of injectable contraceptive: Secondary | ICD-10-CM

## 2019-11-19 MED ORDER — MEDROXYPROGESTERONE ACETATE 150 MG/ML IM SUSP
150.0000 mg | INTRAMUSCULAR | Status: AC
  Administered 2019-11-19: 150 mg via INTRAMUSCULAR

## 2019-11-19 NOTE — Progress Notes (Addendum)
Family Planning Visit- Repeat Yearly Visit  Subjective:  Rhonda Kramer is a 34 y.o. G3P3003  being seen today for an well woman visit and to discuss family planning options.    She is currently using Condoms for pregnancy prevention. Patient reports she does not  want a pregnancy in the next year. Patient  has Irregular uterine contractions; Normal labor and delivery; and Cervical dysplasia on their problem list.  Chief Complaint  Patient presents with  . Contraception    physical and depo    Patient reports that she would like a physical and to start Depo today.  Reports that last sex was 11/16/2019 with a condom and states all sex since last normal period was with condoms.  States that she was previously on Depo.  Reports that she had an abnormal pap last year and was seen at [facility] but is not sure what her follow up plan was.  Per chart review, colpo done 11/2018 was benign and recommendation was for follow up cotest in 1 year.  Also, per record review, patient with last RP here 04/2017. Will do CBE and cotest pap today.  Patient denies any concerns.   See flowsheet for other program required questions.   Body mass index is 33.33 kg/m. - Patient is eligible for diabetes screening based on BMI and age >66?  not applicable HA1C ordered? not applicable  Patient reports 1 partner in last year. Desires STI screening?  No - patient declines.   Has patient been screened once for HCV in the past?  No  No results found for: HCVAB  Does the patient have current of drug use, have a partner with drug use, and/or has been incarcerated since last result? No  If yes-- Screen for HCV through [facility]   Does the patient meet criteria for HBV testing? No  Criteria:  -Household, sexual or needle sharing contact with HBV -History of drug use -HIV positive -Those with known Hep C   Health Maintenance Due  Topic Date Due  . HEMOGLOBIN A1C  Never done  . Hepatitis C Screening  Never  done  . PNEUMOCOCCAL POLYSACCHARIDE VACCINE AGE 27-64 HIGH RISK  Never done  . FOOT EXAM  Never done  . OPHTHALMOLOGY EXAM  Never done  . URINE MICROALBUMIN  Never done  . COVID-19 Vaccine (1) Never done  . HIV Screening  Never done  . INFLUENZA VACCINE  Never done    Review of Systems  All other systems reviewed and are negative.   The following portions of the patient's history were reviewed and updated as appropriate: allergies, current medications, past family history, past medical history, past social history, past surgical history and problem list. Problem list updated.  Objective:   Vitals:   11/19/19 0921  BP: (!) 142/89  Weight: 165 lb (74.8 kg)  Height: 4\' 11"  (1.499 m)    Physical Exam Vitals and nursing note reviewed.  Constitutional:      General: She is not in acute distress.    Appearance: Normal appearance.  HENT:     Head: Normocephalic and atraumatic.     Mouth/Throat:     Mouth: Mucous membranes are moist.     Pharynx: Oropharynx is clear. No oropharyngeal exudate or posterior oropharyngeal erythema.  Eyes:     Conjunctiva/sclera: Conjunctivae normal.  Neck:     Thyroid: No thyroid mass, thyromegaly or thyroid tenderness.  Cardiovascular:     Rate and Rhythm: Normal rate and regular rhythm.  Pulmonary:     Effort: Pulmonary effort is normal.     Breath sounds: Normal breath sounds.  Chest:     Breasts:        Right: Normal. No mass, nipple discharge, skin change or tenderness.        Left: Normal. No mass, nipple discharge, skin change or tenderness.  Abdominal:     Palpations: Abdomen is soft. There is no mass.     Tenderness: There is no abdominal tenderness. There is no guarding or rebound.  Genitourinary:    General: Normal vulva.     Rectum: Normal.     Comments: External genitalia/pubic area without nits, lice, edema, erythema, lesions and inguinal adenopathy. Vagina with normal mucosa and discharge. Cervix without visible  lesions. Uterus firm, mobile, nt, no masses, no CMT, no adnexal tenderness or fullness. Musculoskeletal:     Cervical back: Neck supple. No tenderness.  Lymphadenopathy:     Cervical: No cervical adenopathy.     Upper Body:     Right upper body: No supraclavicular, axillary or pectoral adenopathy.     Left upper body: No supraclavicular, axillary or pectoral adenopathy.  Skin:    General: Skin is warm and dry.     Findings: No bruising, erythema, lesion or rash.  Neurological:     Mental Status: She is alert and oriented to person, place, and time.  Psychiatric:        Mood and Affect: Mood normal.        Behavior: Behavior normal.        Thought Content: Thought content normal.        Judgment: Judgment normal.       Assessment and Plan:  Rhonda Kramer is a 34 y.o. female G3P3003 presenting to the [facility] Department for an yearly well woman exam/family planning visit  Contraception counseling: Reviewed all forms of birth control options in the tiered based approach. available including abstinence; over the counter/barrier methods; hormonal contraceptive medication including pill, patch, ring, injection,contraceptive implant, ECP; hormonal and nonhormonal IUDs; permanent sterilization options including vasectomy and the various tubal sterilization modalities. Risks, benefits, and typical effectiveness rates were reviewed.  Questions were answered.  Written information was also given to the patient to review.  Patient desires to start Depo, this was prescribed for patient. She will follow up in  3 months and prn for surveillance.  She was told to call with any further questions, or with any concerns about this method of contraception.  Emphasized use of condoms 100% of the time for STI prevention.  Patient was not a candidate for ECP today.   1. Encounter for counseling regarding contraception Reviewed with patient normal SE of Depo and when to call clinic for  irregular bleeding. Counseled patient to use condoms as back up for 2 weeks after shot today and check OTC pregnancy test in 2 weeks.  2. Well woman exam with routine gynecological exam Reviewed with patient healthy habits to maintain general health. Reviewed with patient that hormonal methods can affect appetite and enc healthy food choices, regular exercise and plenty of water to help maintain normal weight. Enc MVI 1 po daily. Enc to establish with/ follow up with PCP for primary care concerns, monitoring of blood sugar, following up on elevated BP, age appropriate screenings and illness. Await test results for pap.  Counseled that RN will call or send letter once results are back. - IGP, Aptima HPV  3. Initiation of Depo Provera OK  to start Depo 150 mg IM q 11-13 weeks for 1 year today. - medroxyPROGESTERone (DEPO-PROVERA) injection 150 mg     Return in about 11 weeks (around 02/04/2020) for depo visit.  No future appointments.  Matt Holmes, PA

## 2019-11-19 NOTE — Progress Notes (Signed)
Patient here for physical and depo. Patient currently using condoms for [facility]. Patient had abnormal pap 11/2018. Patient reports she was sent to [facility] for more tests and has not received a follow up call. Plan is unclear.   Harvie Heck, RN  Post:  Depo 150mg  admin in left arm. Depo reminder card given and Healthy Habits booklet.   , RN

## 2019-11-24 LAB — IGP, APTIMA HPV
HPV Aptima: POSITIVE — AB
PAP Smear Comment: 0

## 2019-12-02 ENCOUNTER — Telehealth: Payer: Self-pay | Admitting: Family Medicine

## 2019-12-02 NOTE — Telephone Encounter (Signed)
Call to patient with language line interpreter. Patient called to follow up with pap card she received. RN explained to patient about abnormal pap results and asked if patient was ok with [place] referring her to [facility] for follow up testing. Patient expressed understanding and consented to referral. The best phone number to reach patient is [phone]. Patient does not have insurance at this time.  Harvie Heck, RN

## 2019-12-02 NOTE — Telephone Encounter (Signed)
Patient is calling in regards to pap card received with abnormal results. Patient states she has been calling the number provided and has left voice messages and calls are not being returned. Wants to talk to provider/nurse in regards to results from last year, appointment that she needs for follow up.

## 2019-12-02 NOTE — Progress Notes (Unsigned)
[facility] referral completed today for a Colpo.  Staff message sent to [place] today as well. Patient needs an interpreter. Hart Carwin, RN

## 2019-12-23 ENCOUNTER — Ambulatory Visit: Payer: Self-pay | Attending: Oncology | Admitting: *Deleted

## 2019-12-23 ENCOUNTER — Encounter: Payer: Self-pay | Admitting: *Deleted

## 2019-12-23 DIAGNOSIS — R87610 Atypical squamous cells of undetermined significance on cytologic smear of cervix (ASC-US): Secondary | ICD-10-CM

## 2019-12-23 NOTE — Progress Notes (Signed)
Patient was contacted by Coralee Rud, RN for a televisit to collect health history and consent to our BCCCP program.  Patient was referred to [facility] by the [facility] Department for financial assistance for further evaluation of recent pap of HPV positive ASCUS.  Patient has an appointment with Dr. Logan Bores for colposcopy on 12/30/19 @ 8:00.  Will follow up per B[facility]CP protocol.

## 2019-12-30 ENCOUNTER — Other Ambulatory Visit: Payer: Self-pay

## 2019-12-30 ENCOUNTER — Encounter: Payer: Self-pay | Admitting: Obstetrics and Gynecology

## 2019-12-30 ENCOUNTER — Other Ambulatory Visit (HOSPITAL_COMMUNITY)
Admission: RE | Admit: 2019-12-30 | Discharge: 2019-12-30 | Disposition: A | Discharge status: Home / Self Care | Payer: Self-pay | Source: Ambulatory Visit | Attending: Obstetrics and Gynecology | Admitting: Obstetrics and Gynecology

## 2019-12-30 ENCOUNTER — Ambulatory Visit (INDEPENDENT_AMBULATORY_CARE_PROVIDER_SITE_OTHER): Payer: Self-pay | Admitting: Obstetrics and Gynecology

## 2019-12-30 VITALS — BP 124/84 | HR 74 | Ht 59.0 in | Wt 166.8 lb

## 2019-12-30 DIAGNOSIS — R87610 Atypical squamous cells of undetermined significance on cytologic smear of cervix (ASC-US): Secondary | ICD-10-CM | POA: Insufficient documentation

## 2019-12-30 DIAGNOSIS — N72 Inflammatory disease of cervix uteri: Secondary | ICD-10-CM | POA: Insufficient documentation

## 2019-12-30 DIAGNOSIS — B977 Papillomavirus as the cause of diseases classified elsewhere: Secondary | ICD-10-CM | POA: Insufficient documentation

## 2019-12-30 NOTE — Addendum Note (Signed)
Addended by: Dorian Pod on: 12/30/2019 10:52 AM   Modules accepted: Orders

## 2019-12-30 NOTE — Progress Notes (Signed)
Referring Provider:    HPI:  Rhonda Kramer is a 34 y.o.  [phone]  who presents today for evaluation and management of abnormal cervical cytology.    Dysplasia History:    Patient had a colposcopy approximately 1 year ago for ASCUS positive HPV. An ECC was performed because there were no obvious external lesions at colposcopy.  Her Pap again reveals ASCUS with positive HPV.  ROS:  Pertinent items noted in HPI and remainder of comprehensive ROS otherwise negative.  OB History  Gravida Para Term Preterm AB Living  3 3 3  0 0 3  SAB IAB Ectopic Multiple Live Births        0 3    # Outcome Date GA Lbr Len/2nd Weight Sex Delivery Anes PTL Lv  3 Term 07/12/15 [redacted]w[redacted]d  8 lb 7.8 oz (3.85 kg) M Vag-Spont None  LIV  2 Term 12/03/07   7 lb (3.175 kg) M Vag-Spont   LIV  1 Term 10/21/04   8 lb (3.629 kg) M Vag-Spont  N LIV    Past Medical History:  Diagnosis Date  . Gestational diabetes   . Vaginal Pap smear, abnormal    11/2018    Past Surgical History:  Procedure Laterality Date  . GALLBLADDER SURGERY      SOCIAL HISTORY:  Social History   Substance and Sexual Activity  Alcohol Use No  . Alcohol/week: 0.0 standard drinks    Social History   Substance and Sexual Activity  Drug Use No     Family History  Problem Relation Age of Onset  . Diabetes Mother   . Diabetes Brother   . Diabetes Sister     ALLERGIES:  Oxycodone  She has a current medication list which includes the following Facility-Administered Medications: medroxyprogesterone.  Physical Exam: -Vitals:  BP 124/84   Pulse 74   Ht 4\' 11"  (1.499 m)   Wt 166 lb 12.8 oz (75.7 kg)   LMP 12/05/2019   BMI 33.69 kg/m   PROCEDURE: Colposcopy performed with 4% acetic acid after verbal consent obtained                           -Aceto-white Lesions Location(s): See above -also note read inflammatory type spots in the 12:00 area.              -Biopsy performed at 6 and 12 o'clock               -ECC  indicated and performed: No.     -Biopsy sites made hemostatic with pressure and Monsel's solution   -Satisfactory colposcopy: Yes.      -Evidence of Invasive cervical CA :  NO  ASSESSMENT:  Rhonda Kramer is a 34 y.o. G3P3003 here for  1. Atypical squamous cells of undetermined significance on cytologic smear of cervix (ASC-US)   2. High risk human papilloma virus (HPV) infection of cervix   .  PLAN: 1.  I discussed the grading system of pap smears and HPV high risk viral types.  We will discuss management after colpo results return.  No orders of the defined types were placed in this encounter.          F/U  Return in about 2 weeks (around 01/13/2020) for Colpo f/u.  31 ,MD 12/30/2019,9:08 AM

## 2019-12-31 LAB — SURGICAL PATHOLOGY

## 2020-01-13 ENCOUNTER — Other Ambulatory Visit: Payer: Self-pay

## 2020-01-13 ENCOUNTER — Ambulatory Visit (INDEPENDENT_AMBULATORY_CARE_PROVIDER_SITE_OTHER): Payer: Self-pay | Admitting: Obstetrics and Gynecology

## 2020-01-13 ENCOUNTER — Encounter: Payer: Self-pay | Admitting: Obstetrics and Gynecology

## 2020-01-13 ENCOUNTER — Encounter: Payer: Self-pay | Admitting: *Deleted

## 2020-01-13 VITALS — BP 138/85 | HR 76 | Ht 59.0 in | Wt 168.3 lb

## 2020-01-13 DIAGNOSIS — B977 Papillomavirus as the cause of diseases classified elsewhere: Secondary | ICD-10-CM

## 2020-01-13 DIAGNOSIS — N870 Mild cervical dysplasia: Secondary | ICD-10-CM

## 2020-01-13 DIAGNOSIS — N72 Inflammatory disease of cervix uteri: Secondary | ICD-10-CM

## 2020-01-13 NOTE — Progress Notes (Signed)
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  She has been scheduled to return to Dr. Logan Bores on 01/12/21 for next colposcopy.  Patient will need to be recertified for BCCCP prior to her appointment if she is uninsured at that time.

## 2020-01-13 NOTE — Progress Notes (Signed)
HPI:      [name] is a 34 y.o. [phone] who LMP was No LMP recorded (lmp unknown).  Subjective:   She presents today to discuss her most recent colposcopic findings.  More than a year ago she had HPV positive with cytology negative.  Colpo and ECC at that time was negative.  Her most recent Pap smear showed ASCUS with positive HPV.  Her colposcopy that was just performed revealed CIN-1. Patient did have some questions regarding HPV and the sexual transmitted nature of HPV    Hx: The following portions of the patient's history were reviewed and updated as appropriate:             She  has a past medical history of Gestational diabetes and Vaginal Pap smear, abnormal. She does not have any pertinent problems on file. She  has a past surgical history that includes Gallbladder surgery. Her family history includes Diabetes in her brother, mother, and sister. She  reports that she has never smoked. She has never used smokeless tobacco. She reports that she does not drink alcohol and does not use drugs. She has a current medication list which includes the following Facility-Administered Medications: medroxyprogesterone. She is allergic to oxycodone.       Review of Systems:  Review of Systems  Constitutional: Denied constitutional symptoms, night sweats, recent illness, fatigue, fever, insomnia and weight loss.  Eyes: Denied eye symptoms, eye pain, photophobia, vision change and visual disturbance.  Ears/Nose/Throat/Neck: Denied ear, nose, throat or neck symptoms, hearing loss, nasal discharge, sinus congestion and sore throat.  Cardiovascular: Denied cardiovascular symptoms, arrhythmia, chest pain/pressure, edema, exercise intolerance, orthopnea and palpitations.  Respiratory: Denied pulmonary symptoms, asthma, pleuritic pain, productive sputum, cough, dyspnea and wheezing.  Gastrointestinal: Denied, gastro-esophageal reflux, melena, nausea and vomiting.  Genitourinary: Denied  genitourinary symptoms including symptomatic vaginal discharge, pelvic relaxation issues, and urinary complaints.  Musculoskeletal: Denied musculoskeletal symptoms, stiffness, swelling, muscle weakness and myalgia.  Dermatologic: Denied dermatology symptoms, rash and scar.  Neurologic: Denied neurology symptoms, dizziness, headache, neck pain and syncope.  Psychiatric: Denied psychiatric symptoms, anxiety and depression.  Endocrine: Denied endocrine symptoms including hot flashes and night sweats.   Meds:   No current outpatient medications on file prior to visit.   Current Facility-Administered Medications on File Prior to Visit  Medication Dose Route Frequency Provider Last Rate Last Admin  . medroxyPROGESTERone (DEPO-PROVERA) injection 150 mg  150 mg Intramuscular Q90 days Matt Holmes, [place]   150 mg at 11/19/19 1029          Objective:     Vitals:   01/13/20 0954  BP: 138/85  Pulse: 76   Filed Weights   01/13/20 0954  Weight: 168 lb 4.8 oz (76.3 kg)              CIN-1 by colposcopically directed biopsies  Assessment:    [id number] Patient Active Problem List   Diagnosis Date Noted  . Cervical dysplasia 11/20/2018  . Normal labor and delivery 07/12/2015  . Irregular uterine contractions 07/10/2015     1. CIN I (cervical intraepithelial neoplasia I)   2. High risk human papilloma virus (HPV) infection of cervix        Plan:            1.  Per ASCCP guidelines recommend follow-up colposcopy in 1 year. "  If CIN I persists for more than 2 years-consider treatment" We will follow-up colpo in 1 year. Natural course and history  of HPV discussed in detail.  Lesions of HPV and dysplasia as well as cervical cancer discussed in detail.  All questions answered. Orders No orders of the defined types were placed in this encounter.   No orders of the defined types were placed in this encounter.     F/U  Return in about 1 year (around 01/12/2021). I spent 22 minutes  involved in the care of this patient preparing to see the patient by obtaining and reviewing her medical history (including labs, imaging tests and prior procedures), documenting clinical information in the electronic health record (EHR), counseling and coordinating care plans, writing and sending prescriptions, ordering tests or procedures and directly communicating with the patient by discussing pertinent items from her history and physical exam as well as detailing my assessment and plan as noted above so that she has an informed understanding.  All of her questions were answered.  Elonda Husky, M.D. 01/13/2020 10:16 AM

## 2020-07-29 IMAGING — US US ABDOMEN LIMITED
1 series · 14 of 25 positions shown · non-contrast
Comparison: None.

CLINICAL DATA: RIGHT UPPER QUADRANT pain with sudden onset this
morning. Vomiting.

EXAM:
ULTRASOUND ABDOMEN LIMITED RIGHT UPPER QUADRANT

[Series 1: us abdomen limited · 14 of 51 slices shown]
[im 1/51]
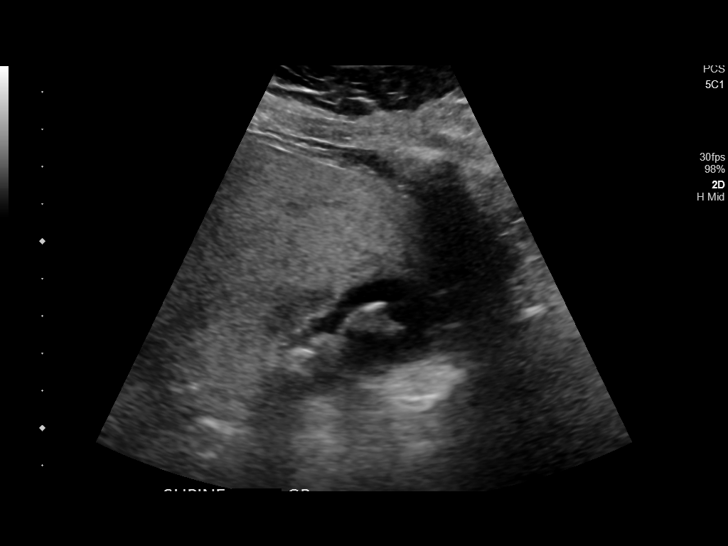
[im 5/51]
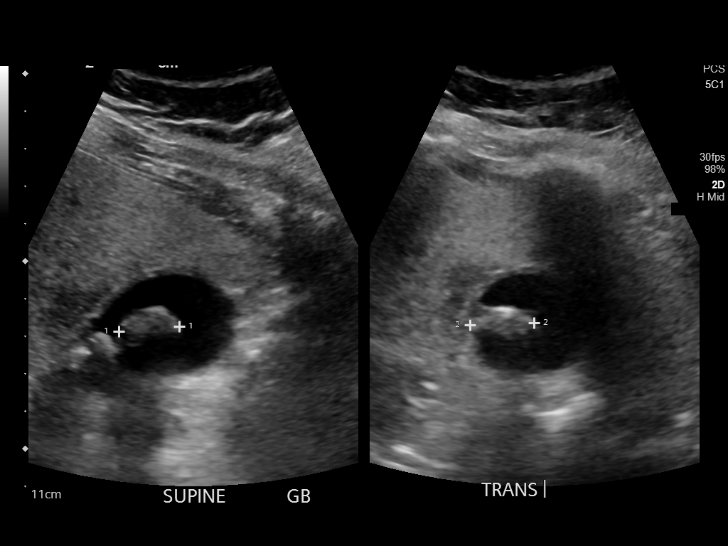
[im 9/51]
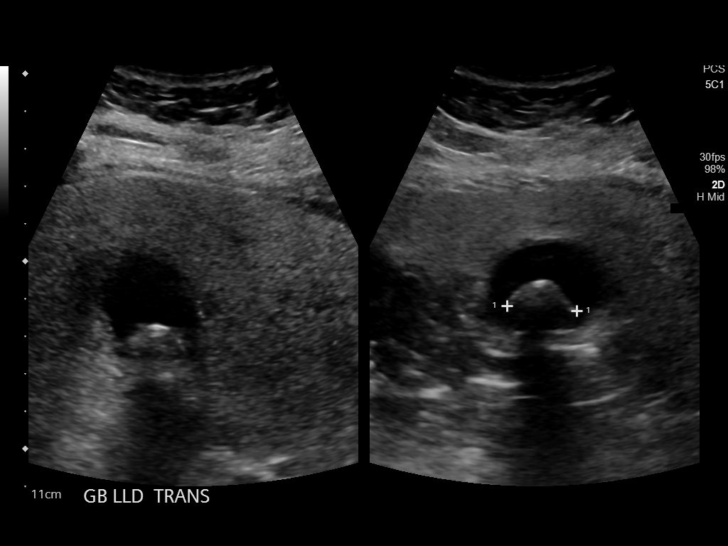
[im 13/51]
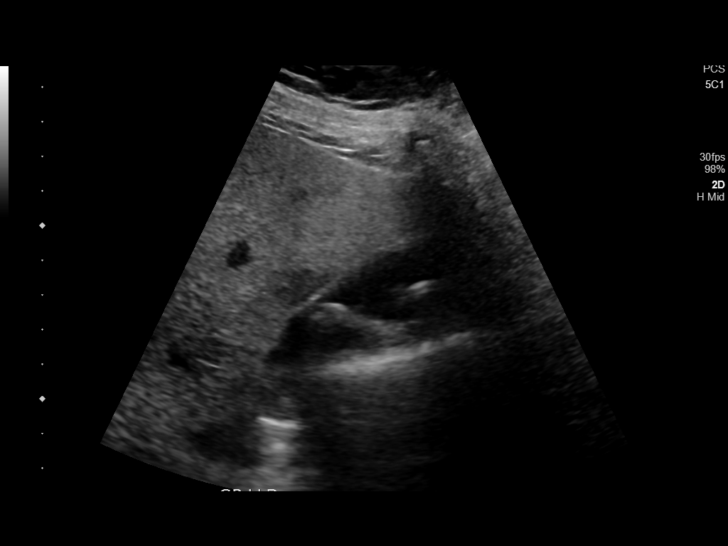
[im 17/51]
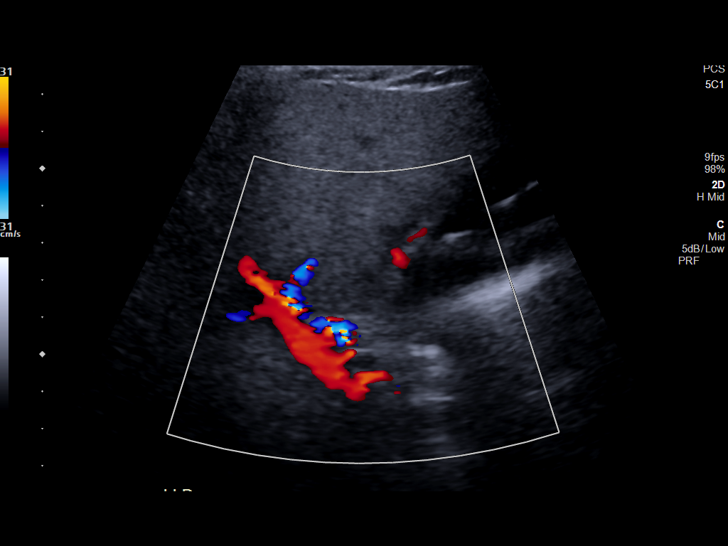
[im 19/51]
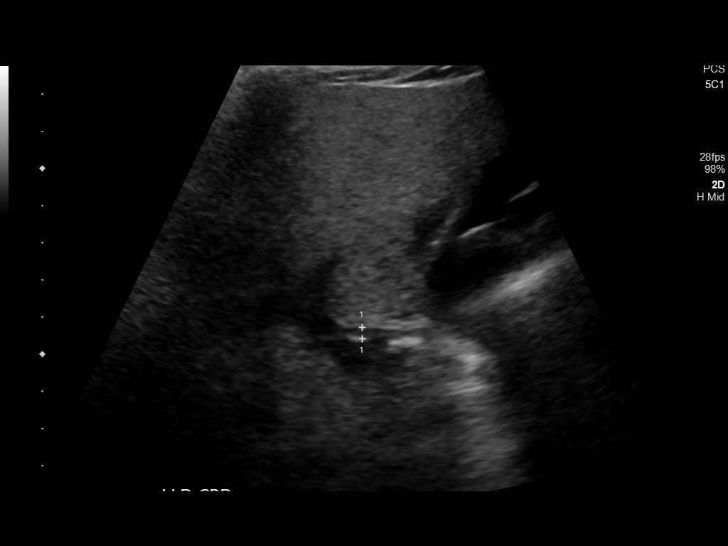
[im 23/51]
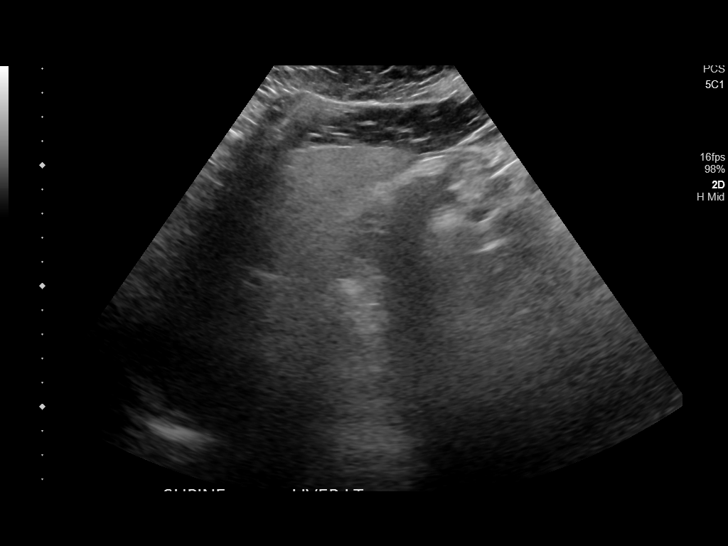
[im 28/51]
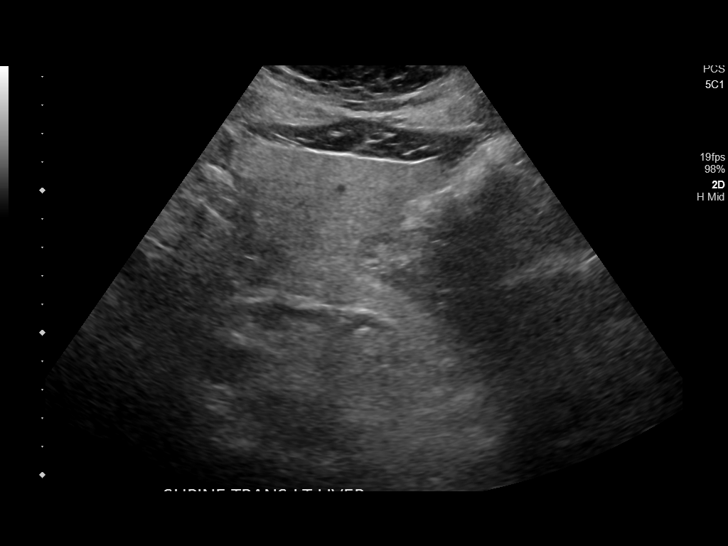
[im 32/51]
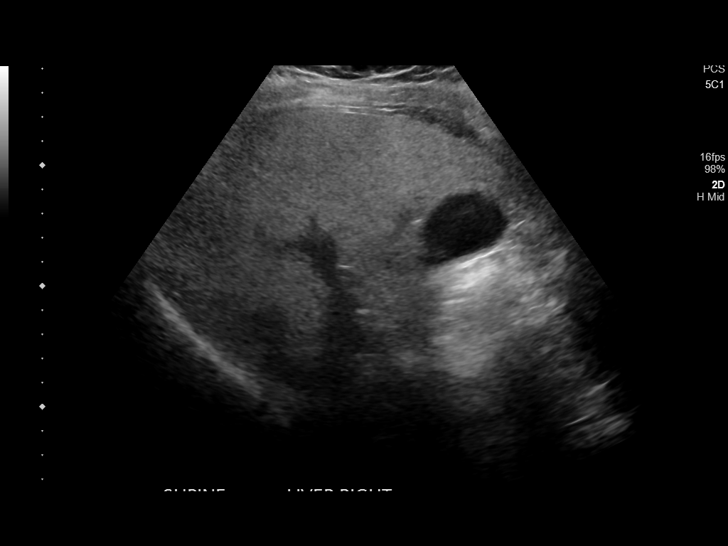
[im 34/51]
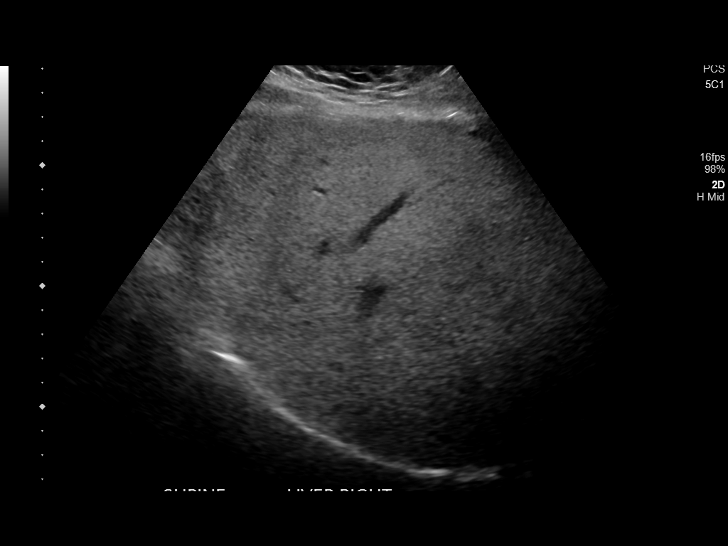
[im 38/51]
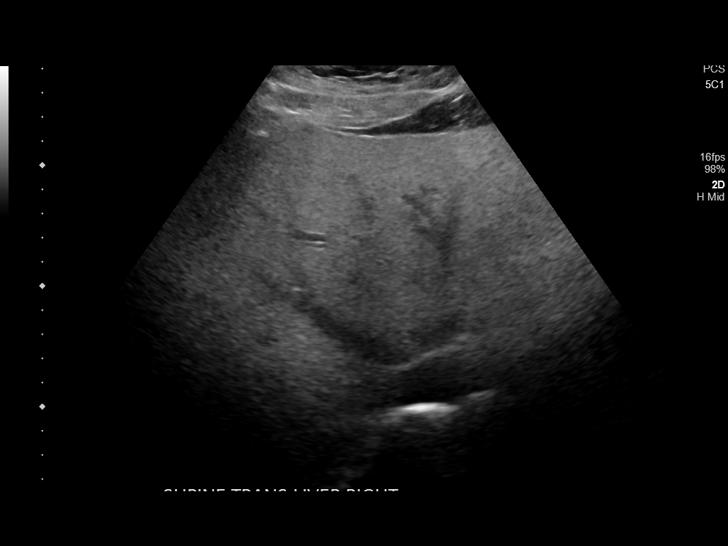
[im 42/51]
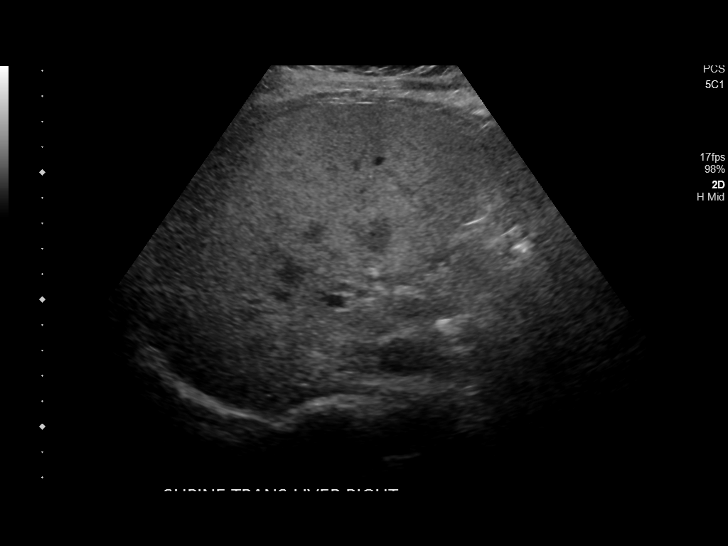
[im 46/51]
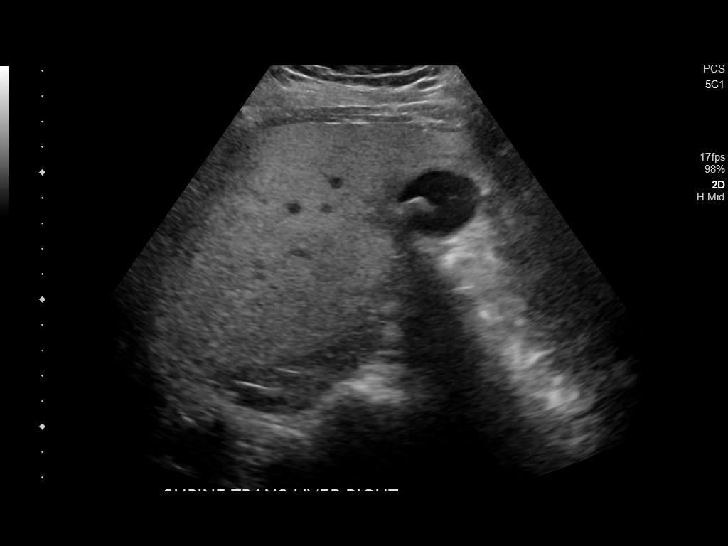
[im 51/51]
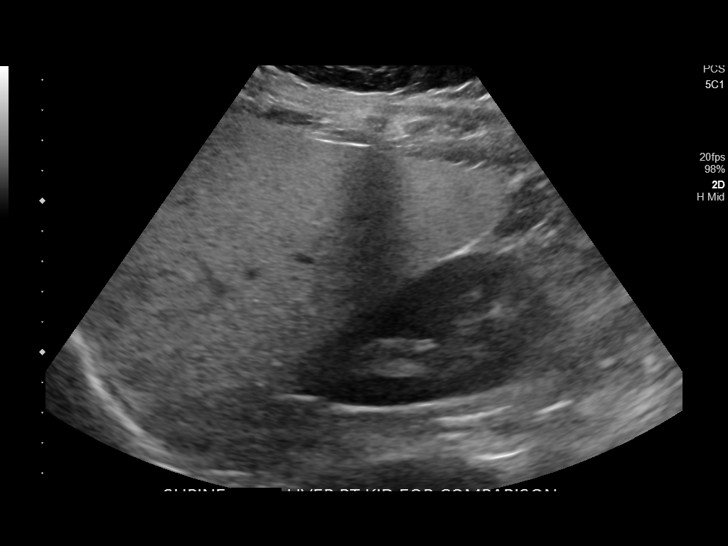

[14 of 25 positions shown; findings below may reference images not displayed]

FINDINGS: Gallbladder:

Gallbladder wall is 2.6 millimeters. Multiple mobile stones are
identified, largest measuring approximately 19 millimeters in
diameter. No sonographic Murphy sign or pericholecystic fluid.

Common bile duct:

Diameter: 4.0 millimeters

Liver:

The liver is echogenic. There is attenuation of the ultrasound wave,
poor visualization of the internal hepatic architecture, and loss of
definition of the diaphragm. Suspect focal fatty sparing in the
gallbladder fossa region. No focal liver lesions are identified.
Portal vein is patent on color Doppler imaging with normal direction
of blood flow towards the liver.

Other: None.
IMPRESSION: 1. Cholelithiasis without other evidence for acute cholecystitis.
2. Hepatic steatosis.

## 2020-08-20 IMAGING — US US ABDOMEN LIMITED
1 series · 14 of 25 positions shown · non-contrast
Comparison: 10/02/2018

CLINICAL DATA: Right upper quadrant pain with nausea and vomiting

EXAM:
ULTRASOUND ABDOMEN LIMITED RIGHT UPPER QUADRANT

[Series 1: us abdomen limited · 0.25mm/px · 14 of 44 slices shown]
[im 1/44]
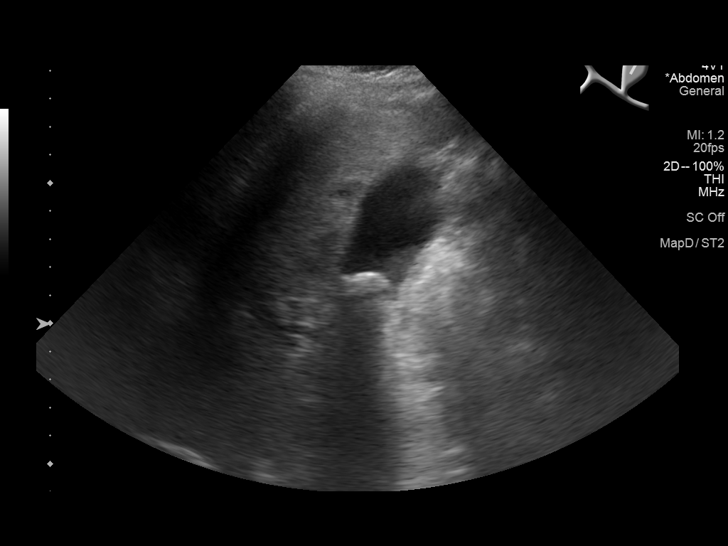
[im 4/44]
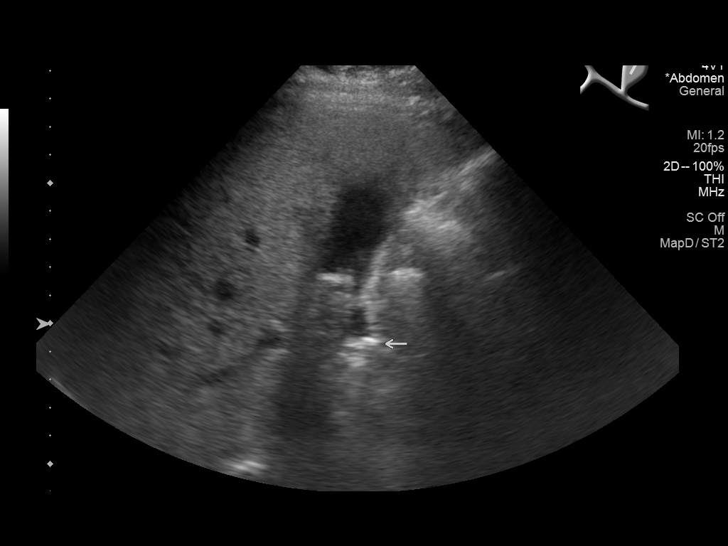
[im 8/44]
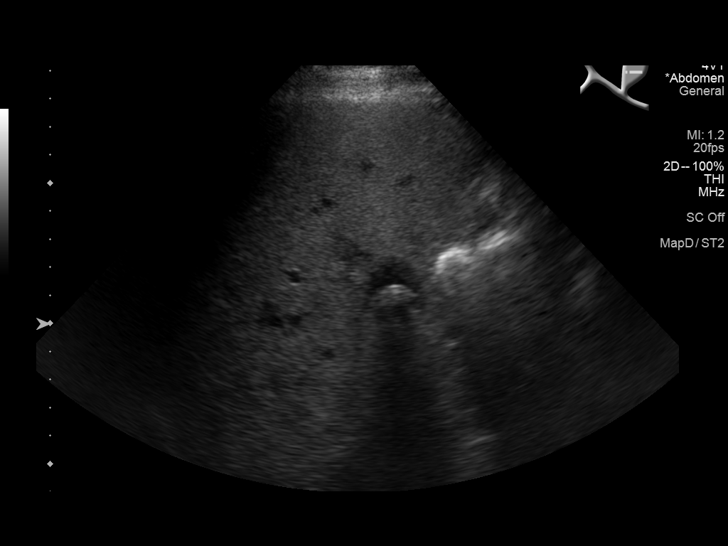
[im 11/44]
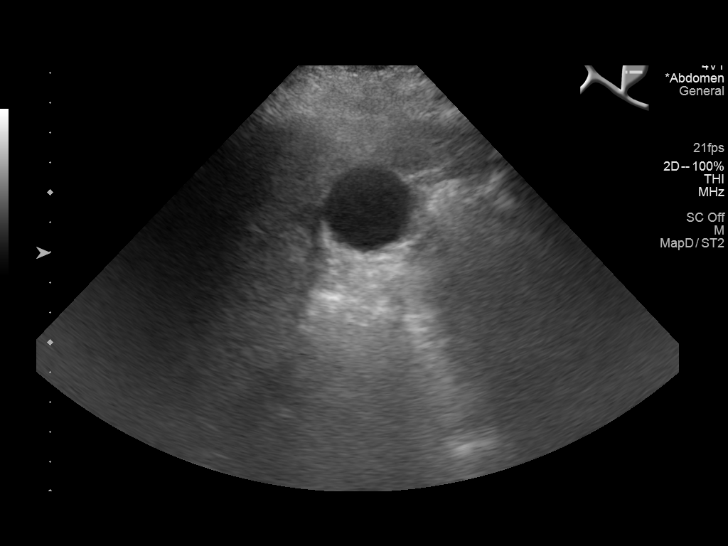
[im 15/44]
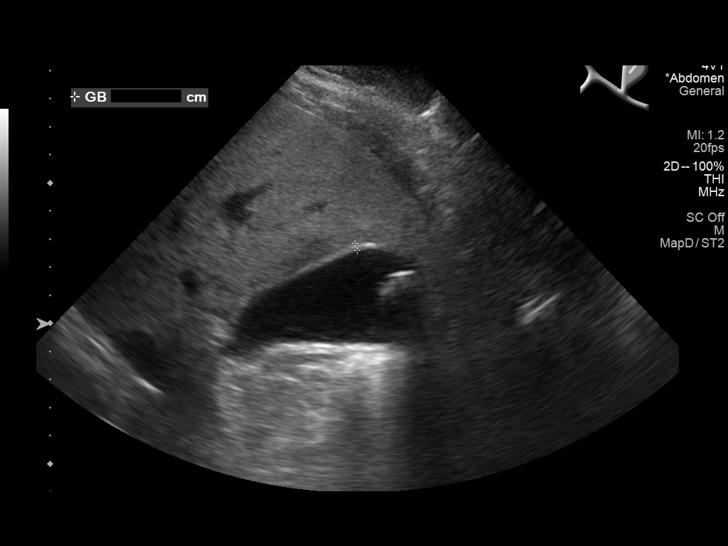
[im 17/44]
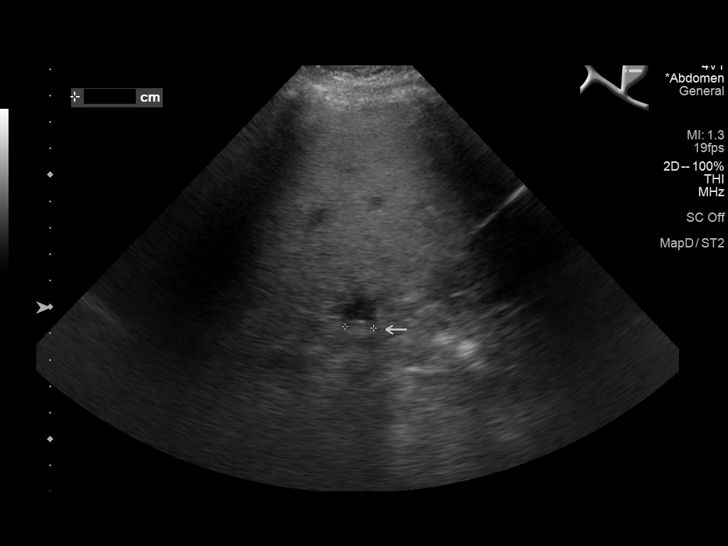
[im 20/44]
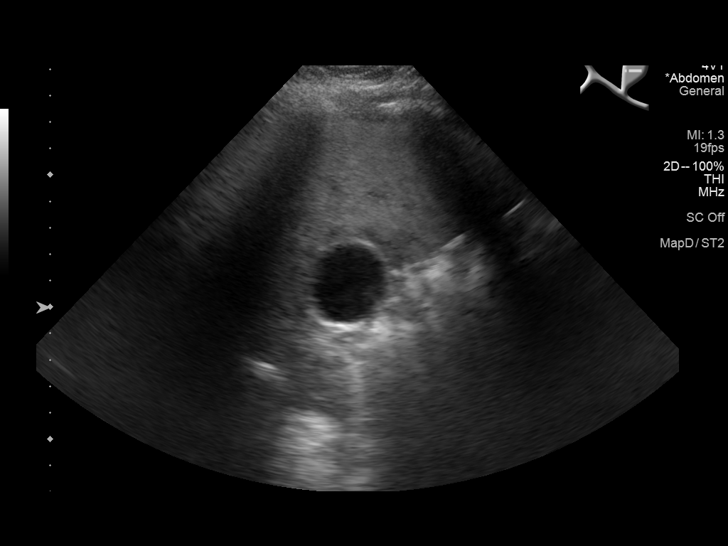
[im 24/44]
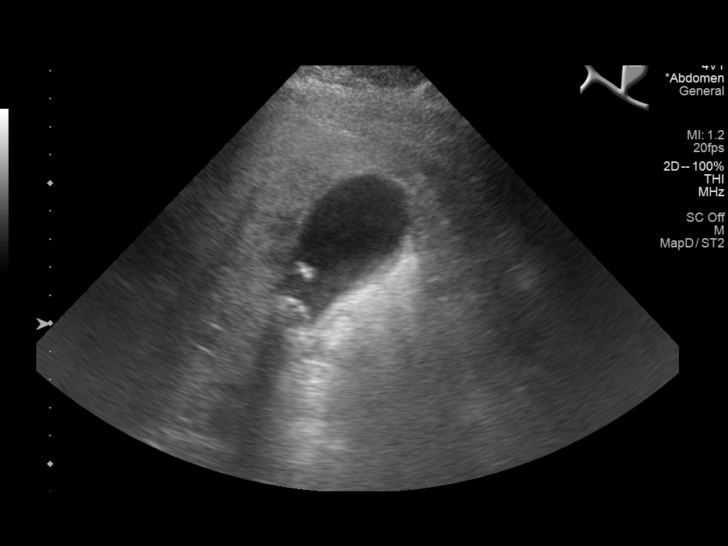
[im 27/44]
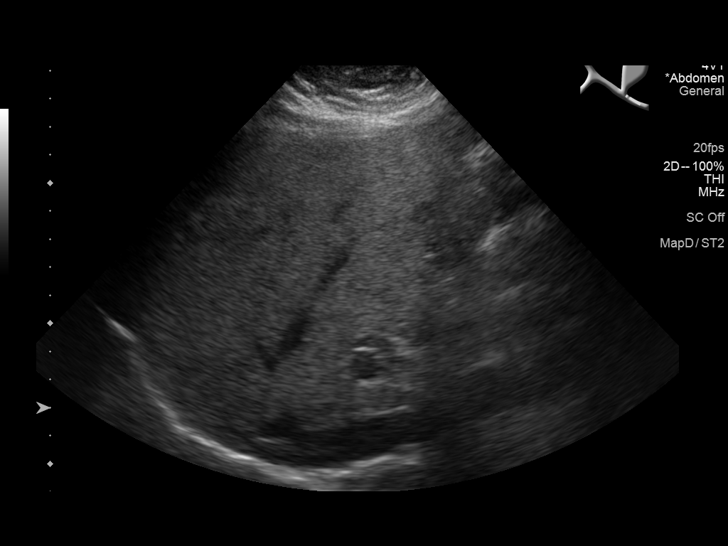
[im 29/44]
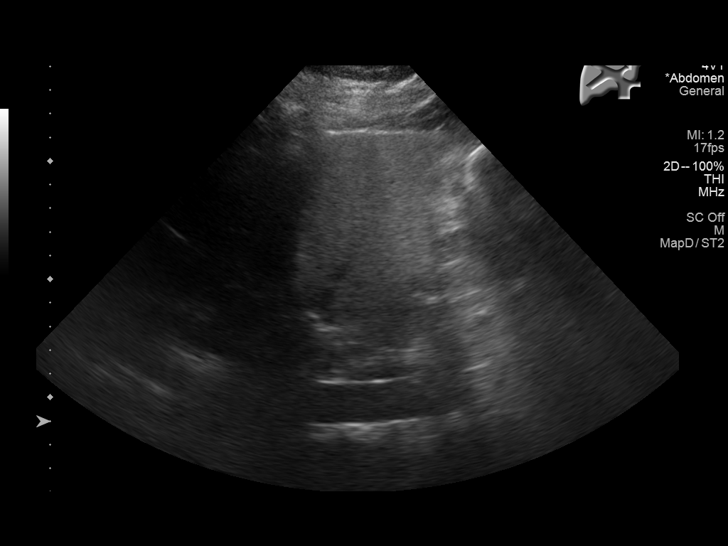
[im 33/44]
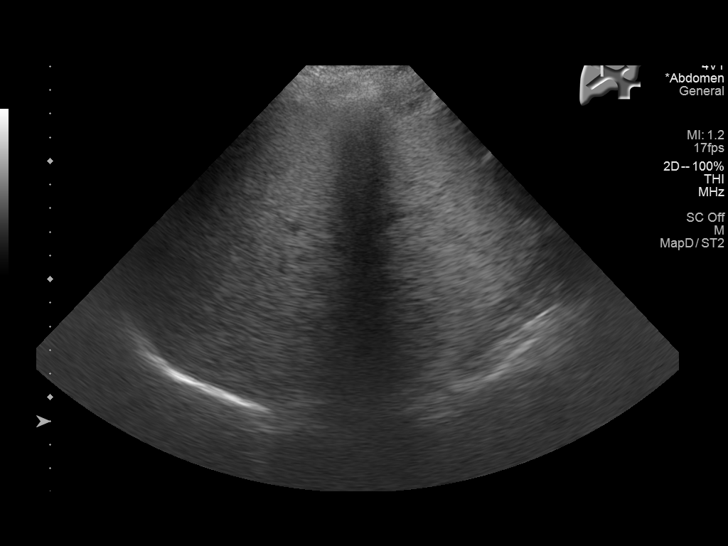
[im 36/44]
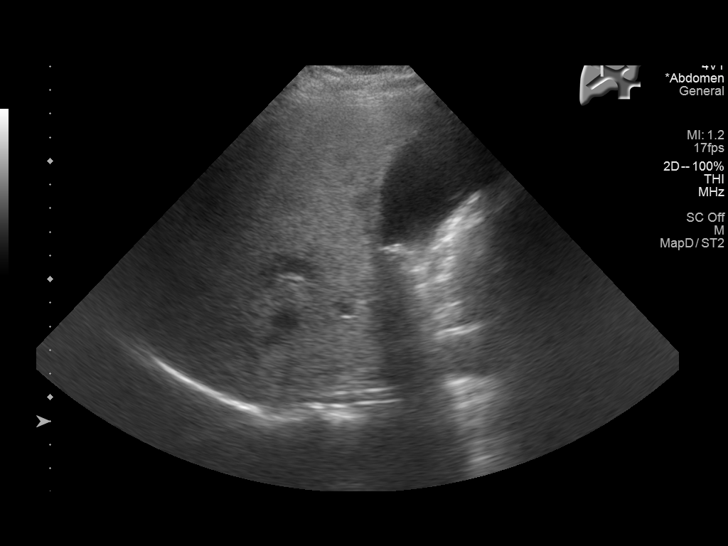
[im 40/44]
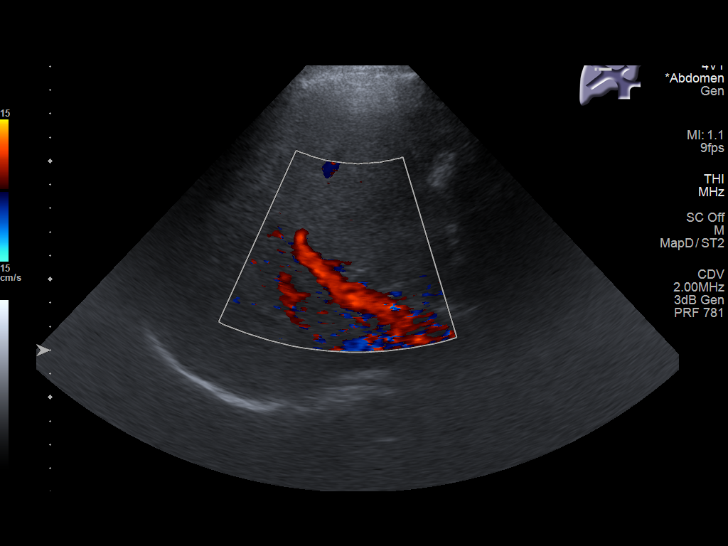
[im 44/44]
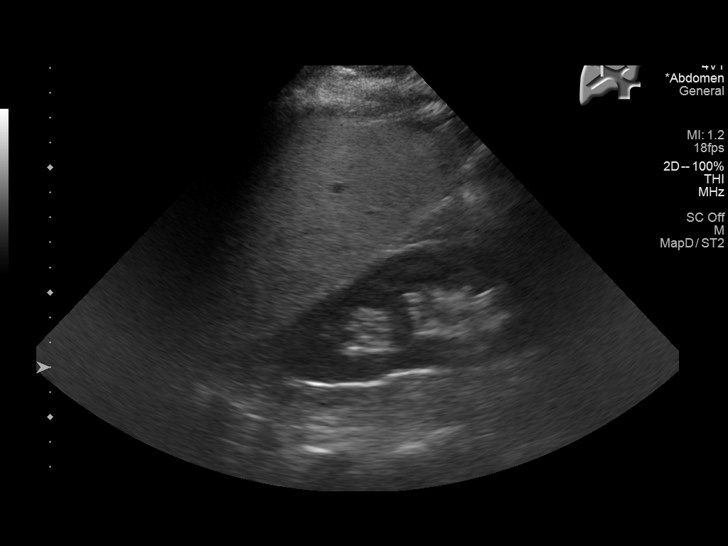

[14 of 25 positions shown; findings below may reference images not displayed]

FINDINGS: Gallbladder:

Gallstones measuring up to 2 cm, including stone fixed at the neck.
No gallbladder wall thickening or focal tenderness. No
pericholecystic edema when accounting for fat sparing

Common bile duct:

Diameter: 4 mm. The distal common bile duct was not visible due to
bowel gas

Liver:

Echogenic liver with sparing at the gallbladder fossa. Portal vein
is patent on color Doppler imaging with normal direction of blood
flow towards the liver.
IMPRESSION: 1. Cholelithiasis with stone fixed at the gallbladder neck. No signs
of acute cholecystitis.
2. Hepatic steatosis.

## 2020-12-21 ENCOUNTER — Telehealth: Payer: Self-pay

## 2020-12-21 NOTE — Telephone Encounter (Signed)
Telephone call to patient today to remind her of her previously scheduled appointments with BCCCP 9 am and Dr. Logan Bores for Colpo 11 am on 01/12/21.  Both her mobile and home # has a voicemail that has not been set up. Hart Carwin, RN

## 2020-12-21 NOTE — Telephone Encounter (Signed)
Return call by patient.  She is aware of her appointments on 01/12/21 with BCCCP and Dr. Logan Bores on 01/12/21.  She will call BCCCP to confirm.  Salli Real interpreted the call today. Hart Carwin, RN

## 2021-01-12 ENCOUNTER — Encounter: Payer: Self-pay | Admitting: Obstetrics and Gynecology

## 2021-01-12 ENCOUNTER — Ambulatory Visit: Payer: MEDICAID | Attending: Oncology | Admitting: *Deleted

## 2021-01-12 DIAGNOSIS — R87610 Atypical squamous cells of undetermined significance on cytologic smear of cervix (ASC-US): Secondary | ICD-10-CM

## 2021-01-12 NOTE — Progress Notes (Signed)
Tried to contact patient for her televisit today via [place] [facility]  [phone] with [employer].  Jillene Bucks answered the home phone.  He states she is at work.  Requested that she return my call, that it's related to appointment she has tomorrow ,and I must talk with her in order to help financially assist with that appointment.  Patient called on 01/13/21.  Appointments rescheduled by Joellyn Quails.

## 2021-01-13 ENCOUNTER — Telehealth: Payer: Self-pay | Admitting: Oncology

## 2021-01-13 NOTE — Telephone Encounter (Signed)
Patient left message with answering service about her missed call on 12/28 as she was working.  Routing to team for follow-up.

## 2021-03-01 ENCOUNTER — Ambulatory Visit: Payer: MEDICAID | Attending: Oncology

## 2021-03-01 ENCOUNTER — Telehealth: Payer: Self-pay | Admitting: *Deleted

## 2021-03-01 ENCOUNTER — Encounter: Payer: Self-pay | Admitting: Obstetrics and Gynecology

## 2021-03-01 DIAGNOSIS — N870 Mild cervical dysplasia: Secondary | ICD-10-CM

## 2021-04-11 ENCOUNTER — Other Ambulatory Visit: Payer: Self-pay

## 2021-04-11 ENCOUNTER — Encounter: Payer: Self-pay | Admitting: Emergency Medicine

## 2021-04-11 ENCOUNTER — Emergency Department: Payer: Self-pay

## 2021-04-11 ENCOUNTER — Emergency Department
Admission: EM | Admit: 2021-04-11 | Discharge: 2021-04-12 | Disposition: A | Discharge status: Home / Self Care | Payer: Self-pay | Attending: Student in an Organized Health Care Education/Training Program | Admitting: Student in an Organized Health Care Education/Training Program

## 2021-04-11 DIAGNOSIS — J029 Acute pharyngitis, unspecified: Secondary | ICD-10-CM | POA: Insufficient documentation

## 2021-04-11 DIAGNOSIS — Z20822 Contact with and (suspected) exposure to covid-19: Secondary | ICD-10-CM | POA: Insufficient documentation

## 2021-04-11 LAB — BASIC METABOLIC PANEL
Anion gap: 10 (ref 5–15)
BUN: 8 mg/dL (ref 6–20)
CO2: 21 mmol/L — ABNORMAL LOW (ref 22–32)
Calcium: 9 mg/dL (ref 8.9–10.3)
Chloride: 104 mmol/L (ref 98–111)
Creatinine, Ser: 0.52 mg/dL (ref 0.44–1.00)
GFR, Estimated: >60 mL/min (ref >60)
Glucose, Bld: 95 mg/dL (ref 70–99)
Potassium: 3.2 mmol/L — ABNORMAL LOW (ref 3.5–5.1)
Sodium: 135 mmol/L (ref 135–145)

## 2021-04-11 LAB — CBC
HCT: 36.3 % (ref 36.0–46.0)
Hemoglobin: 11.9 g/dL — ABNORMAL LOW (ref 12.0–15.0)
MCH: 30.1 pg (ref 26.0–34.0)
MCHC: 32.8 g/dL (ref 30.0–36.0)
MCV: 91.7 fL (ref 80.0–100.0)
Platelets: 345 10*3/uL (ref 150–400)
RBC: 3.96 MIL/uL (ref 3.87–5.11)
RDW: 13.3 % (ref 11.5–15.5)
WBC: 15.3 10*3/uL — ABNORMAL HIGH (ref 4.0–10.5)
nRBC: 0 % (ref 0.0–0.2)

## 2021-04-11 LAB — RESP PANEL BY RT-PCR (FLU A&B, COVID) ARPGX2
Influenza A by PCR: NEGATIVE
Influenza B by PCR: NEGATIVE
SARS Coronavirus 2 by RT PCR: NEGATIVE

## 2021-04-11 LAB — TROPONIN I (HIGH SENSITIVITY): Troponin I (High Sensitivity): 12 ng/L (ref <18)

## 2021-04-11 MED ORDER — ACETAMINOPHEN 325 MG PO TABS
650.0000 mg | ORAL_TABLET | Freq: Once | ORAL | Status: AC
  Administered 2021-04-11: 650 mg via ORAL
  Filled 2021-04-11: qty 2

## 2021-04-11 MED ORDER — AMOXICILLIN-POT CLAVULANATE 875-125 MG PO TABS
1.0000 | ORAL_TABLET | Freq: Once | ORAL | Status: AC
  Administered 2021-04-11: 1 via ORAL
  Filled 2021-04-11: qty 1

## 2021-04-11 MED ORDER — DEXAMETHASONE 6 MG PO TABS
10.0000 mg | ORAL_TABLET | Freq: Once | ORAL | Status: AC
  Administered 2021-04-11: 10 mg via ORAL
  Filled 2021-04-11: qty 1

## 2021-04-11 NOTE — ED Triage Notes (Signed)
Pt via POV from home. Pt c/o R ear pain that radiates down R shoulder and R back pain. Pt c/o chest pain and SOB. States this started yesterday. Pt also endorses a fever. Last dose of Tylenol was 12:30pm. Denies cough or congestion. Pt is A&OX4 and NAD ?

## 2021-04-11 NOTE — ED Triage Notes (Signed)
First Nurse Note:  Arrives c/o right ear pain that radiates down into right shoulder and into right back.  Patient tearful.  C/O pain with breathing.   ? ?Respirations regular and non labored.  NAD.  Skin warm and dry.  ?

## 2021-04-11 NOTE — ED Provider Notes (Signed)
? ?[facility] ?Provider Note ? ? ? Event Date/Time  ? First MD Initiated Contact with Patient 04/11/21 2101   ?  (approximate) ? ? ?History  ? ?No chief complaint on file. ? ? ?HPI ? ?Rhonda Kramer is a 36 y.o. female previously healthy presents to the ER for 24 to 36 hours of right ear pain sore throat trouble swallowing.  States he was seen at [facility] and told to come to the ER because she had temperature and heart rate is elevated.  Is also been having some nonproductive cough.  Denies any chest pain.  No known sick contacts. ?  ? ? ?Physical Exam  ? ?Triage Vital Signs: ?ED Triage Vitals [04/11/21 1805]  ?Enc Vitals Group  ?   BP (!) 141/90  ?   Pulse Rate (!) 115  ?   Resp 20  ?   Temp (!) 102.1 ?F (38.9 ?C)  ?   Temp Source Oral  ?   SpO2 97 %  ?   Weight 190 lb (86.2 kg)  ?   Height 4\' 11"  (1.499 m)  ?   Head Circumference   ?   Peak Flow   ?   Pain Score 8  ?   Pain Loc   ?   Pain Edu?   ?   Excl. in GC?   ? ? ?Most recent vital signs: ?Vitals:  ? 04/11/21 2049 04/11/21 2100  ?BP:  118/79  ?Pulse: 92 87  ?Resp: 20 16  ?Temp:    ?SpO2: 99% 97%  ? ? ? ?Constitutional: Alert  ?Eyes: Conjunctivae are normal.  ?Head: Atraumatic. ?Nose: No congestion/rhinnorhea. ?Mouth/Throat: Mucous membranes are moist.  Bilateral tonsillar erythema with exudates.  Right TM dull sunken with effusion.  Left TM normal.  No mastoid tenderness.  No trismus.  No sublingual induration or fullness. ?Neck: Painless ROM.  Tender right anterior cervical chain lymphadenopathy.  No stridor. ?Cardiovascular:   Good peripheral circulation. ?Respiratory: Normal respiratory effort.  No retractions.  ?Gastrointestinal: Soft and nontender.  ?Musculoskeletal:  no deformity ?Neurologic:  MAE spontaneously. No gross focal neurologic deficits are appreciated.  ?Skin:  Skin is warm, dry and intact. No rash noted. ?Psychiatric: Mood and affect are normal. Speech and behavior are normal. ? ? ? ?ED Results / Procedures /  Treatments  ? ?Labs ?(all labs ordered are listed, but only abnormal results are displayed) ?Labs Reviewed  ?BASIC METABOLIC PANEL - Abnormal; Notable for the following components:  ?    Result Value  ? Potassium 3.2 (*)   ? CO2 21 (*)   ? All other components within normal limits  ?CBC - Abnormal; Notable for the following components:  ? WBC 15.3 (*)   ? Hemoglobin 11.9 (*)   ? All other components within normal limits  ?RESP PANEL BY RT-PCR (FLU A&B, COVID) ARPGX2  ?POC URINE PREG, ED  ?TROPONIN I (HIGH SENSITIVITY)  ? ? ? ?EKG ?ED ECG REPORT ?I, 2101, the attending physician, personally viewed and interpreted this ECG. ? ? Date: 04/11/2021 ? EKG Time: 18:07 ? Rate: 110 ? Rhythm: sinus ? Axis: normal ? Intervals: normal  ? ST&T Change: no stemi, no depression ? ? ? ? ?RADIOLOGY ?Please see ED Course for my review and interpretation. ? ?I personally reviewed all radiographic images ordered to evaluate for the above acute complaints and reviewed radiology reports and findings.  These findings were personally discussed with the patient.  Please see medical  record for radiology report. ? ? ? ?PROCEDURES: ? ?Critical Care performed: No ? ?Procedures ? ? ?MEDICATIONS ORDERED IN ED: ?Medications  ?acetaminophen (TYLENOL) tablet 650 mg (650 mg Oral Given 04/11/21 1810)  ?dexamethasone (DECADRON) tablet 10 mg (10 mg Oral Given 04/11/21 2140)  ?amoxicillin-clavulanate (AUGMENTIN) 875-125 MG per tablet 1 tablet (1 tablet Oral Given 04/11/21 2140)  ? ? ? ?IMPRESSION / MDM / ASSESSMENT AND PLAN / ED COURSE  ?I reviewed the triage vital signs and the nursing notes. ?             ?               ? ?Differential diagnosis includes, but is not limited to, URI, AOM, strep, pharyngitis, PTA, RPA, Ludwick's, Lemierre's syndrome, ? ?Patient presented to the ER febrile tachycardic with symptoms as described above.  Clinically consistent with URI probable pharyngitis not consistent with PTA or RPA no findings to suggest  epiglottitis or deep space infection.  No sign of mastoiditis.  Not consistent with Ludwick's or Lemierre's syndrome.  We will plan to treat with antibiotic as she does have exudates and is clinically consistent with strep pharyngitis also with evidence of acute otitis on the right.  She denies any abdominal pain.  Chest x-ray on my review and interpretation does not show any evidence of pneumothorax or consolidation.  She denies any dysuria no flank pain. ?Clinical Course as of 04/11/21 2217  ?Mon Apr 11, 2021  ?2212 Reassessed.  Remains well-appearing in no acute distress. [PR]  ?2213 She is tolerating p.o.  At this point do believe she stable and appropriate for outpatient follow-up.  Will be given a prescription for Augmentin.  Discussed return precautions.  Patient family agreeable to plan. [PR]  ?  ?Clinical Course User Index ?[PR] Willy Eddy, MD  ? ? ? ?FINAL CLINICAL IMPRESSION(S) / ED DIAGNOSES  ? ?Final diagnoses:  ?Pharyngitis, unspecified etiology  ? ? ? ?Rx / DC Orders  ? ?ED Discharge Orders   ? ? None  ? ?  ? ? ? ?Note:  This document was prepared using Dragon voice recognition software and may include unintentional dictation errors. ? ?  ?Willy Eddy, MD ?04/11/21 2217 ? ?

## 2021-04-12 NOTE — ED Notes (Signed)
Pt discharge information reviewed. Pt understands need for follow up care and when to return if symptoms worsen. All questions answered. Pt is alert and oriented with even and regular respirations. Pt is seen ambulating out of department with string steady gait.   

## 2021-05-04 ENCOUNTER — Ambulatory Visit: Payer: Self-pay

## 2021-05-18 ENCOUNTER — Ambulatory Visit: Payer: MEDICAID | Attending: Hematology and Oncology | Admitting: *Deleted

## 2021-05-18 VITALS — BP 140/91 | HR 71 | Wt 169.0 lb

## 2021-05-18 DIAGNOSIS — R87610 Atypical squamous cells of undetermined significance on cytologic smear of cervix (ASC-US): Secondary | ICD-10-CM

## 2021-05-18 DIAGNOSIS — Z01419 Encounter for gynecological examination (general) (routine) without abnormal findings: Secondary | ICD-10-CM

## 2021-05-18 NOTE — Progress Notes (Signed)
Rhonda Kramer is a 36 y.o. [phone] female who presents to [facility] clinic today with no complaints.  ?  ?Pap Smear: Pap smear completed today. 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 Per patient has history of one other abnormal Pap smear 09/06/2018 that was ASCUS with positive HPV that a colposcopy was completed for follow up 11/20/2018 that was benign. Last two Pap smear and colposcopy results is available in Epic. ?  ?Physical exam: ?Breasts ?Breasts symmetrical. No skin abnormalities bilateral breasts. No nipple retraction bilateral breasts. No nipple discharge bilateral breasts. No lymphadenopathy. No lumps palpated bilateral breasts. No complaints of pain or tenderness on exam. Screening mammogram recommended at age 38 unless clinically indicated prior.    ?  ?Pelvic/Bimanual ?Ext Genitalia ?No lesions, no swelling and no discharge observed on external genitalia.      ?  ?Vagina ?Vagina pink and normal texture. No lesions or discharge observed in vagina.      ?  ?Cervix ?Cervix is present. Cervix pink and of normal texture. No discharge observed.  ?  ?Uterus ?Uterus is present and palpable. Uterus in normal position and normal size.      ?  ?Adnexae ?Bilateral ovaries present and palpable. No tenderness on palpation.       ?  ?Rectovaginal ?No rectal exam completed today since patient had no rectal complaints. No skin abnormalities observed on exam.   ?  ?Smoking History: ?Patient has never smoked. ?  ?Patient Navigation: ?Patient education provided. Access to services provided for patient through [facility] program. Spanish interpreter Thyra Breed [phone] provided by [employer].  ?  ?Breast and Cervical Cancer Risk Assessment: ?Patient does not have family history of breast cancer, known genetic mutations, or radiation treatment to the chest before age 46.  Patient has history of cervical dysplasia Patient has no history of being immunocompromised or DES exposure in-utero. ? ?Risk Assessment   ? ? Risk Scores   ? ?   05/18/2021  ? Last edited by: Lesle Chris, RN  ? 5-year risk: 0.3 %  ? Lifetime risk: 6.4 %  ? ?  ?  ? ?  ? ? ?A: ?BCCCP exam with pap smear ?No complaints. ? ?P: ?No referrals. ? ?Priscille Heidelberg, RN ?05/18/2021 11:42 AM   ?

## 2021-05-18 NOTE — Patient Instructions (Signed)
Explained breast self awareness with Damian Leavell Paxtian. Pap smear completed today. Let patient know that if today's Pap smear is normal and HPV negative that her next Pap smear is due in one year due to her history of her last two Pap smears were abnormal. Let patient know will follow up with her within the next couple weeks with results of her Pap smear by phone. Informed patient that a screening mammogram is recommended at age 36 unless clinically indicated prior. Damian Leavell Paxtian verbalized understanding. ? ?Stepehn Eckard, Kathaleen Maser, RN ?11:42 AM ? ? ? ? ?

## 2021-05-19 ENCOUNTER — Encounter: Payer: Self-pay | Admitting: Obstetrics and Gynecology

## 2021-05-19 DIAGNOSIS — N870 Mild cervical dysplasia: Secondary | ICD-10-CM

## 2021-05-23 LAB — CYTOLOGY - PAP
Comment: NEGATIVE
Diagnosis: UNDETERMINED — AB
High risk HPV: NEGATIVE

## 2021-05-25 ENCOUNTER — Telehealth: Payer: Self-pay

## 2021-05-25 NOTE — Telephone Encounter (Addendum)
Via [employer], Spanish Interpreter [facility]), Patient returned call and was informed pap results, ASC-US with negative HPV,discussed with patient,  needs to repeat pap in 1 year. Patient verbalized understanding.  ? ? ?Via, Kandis Cocking, Spanish Interpreter [employer]), attempted to contact patient regarding pap test results. Left message on voicemail requesting a return call.  ?

## 2021-08-19 ENCOUNTER — Ambulatory Visit (LOCAL_COMMUNITY_HEALTH_CENTER): Payer: Self-pay | Admitting: Family Medicine

## 2021-08-19 ENCOUNTER — Encounter: Payer: Self-pay | Admitting: Family Medicine

## 2021-08-19 VITALS — BP 123/85 | HR 84 | Temp 97.1°F | Ht 59.0 in | Wt 172.2 lb

## 2021-08-19 DIAGNOSIS — Z01419 Encounter for gynecological examination (general) (routine) without abnormal findings: Secondary | ICD-10-CM

## 2021-08-19 DIAGNOSIS — Z3009 Encounter for other general counseling and advice on contraception: Secondary | ICD-10-CM

## 2021-08-19 DIAGNOSIS — N879 Dysplasia of cervix uteri, unspecified: Secondary | ICD-10-CM

## 2021-08-19 DIAGNOSIS — Z30013 Encounter for initial prescription of injectable contraceptive: Secondary | ICD-10-CM

## 2021-08-19 DIAGNOSIS — Z113 Encounter for screening for infections with a predominantly sexual mode of transmission: Secondary | ICD-10-CM

## 2021-08-19 MED ORDER — MEDROXYPROGESTERONE ACETATE 150 MG/ML IM SUSP
150.0000 mg | INTRAMUSCULAR | Status: AC
  Administered 2021-08-19 – 2021-11-18 (×2): 150 mg via INTRAMUSCULAR

## 2021-08-19 NOTE — Progress Notes (Signed)
[facility] DEPARTMENT [facility] [address] Main Number: [phone]  Family Planning Visit- Repeat Yearly Visit  Subjective:  Rhonda Kramer is a 36 y.o. G3P3003  being seen today for an annual wellness visit and to discuss contraception options.   The patient is currently using Female Condom for pregnancy prevention. Patient does not want a pregnancy in the next year.    report they are looking for a method that provides Methods that does not involve too much memory  She has use pills, depo, nexplanon and IUD in the past.   Patient has the following medical problems: has Irregular uterine contractions; Normal labor and delivery; and Cervical dysplasia on their problem list.  Chief Complaint  Patient presents with   Contraception    Patient reports previously being on depo and liked this method. SHe was on it 15 years ago.   Patient denies liver disease. No history of migraines or HTN per report.    See flowsheet for other program required questions.   Body mass index is 34.78 kg/m. - Patient is eligible for diabetes screening based on BMI and age >76?  no HA1C ordered? yes  Patient reports 1 of partners in last year. Desires STI screening?  Yes- vaginal only   Has patient been screened once for HCV in the past?  No  No results found for: "HCVAB"  Does the patient have current of drug use, have a partner with drug use, and/or has been incarcerated since last result? No  If yes-- Screen for HCV through [facility]   Does the patient meet criteria for HBV testing? No  Criteria:  -Household, sexual or needle sharing contact with HBV -History of drug use -HIV positive -Those with known Hep C   Health Maintenance Due  Topic Date Due   HEMOGLOBIN A1C  Never done   COVID-19 Vaccine (1) Never done   FOOT EXAM  Never done   OPHTHALMOLOGY EXAM  Never done   URINE MICROALBUMIN  Never done   HIV Screening  Never done    Hepatitis C Screening  Never done   INFLUENZA VACCINE  08/16/2021    Review of Systems  Constitutional:  Negative for chills and fever.  Eyes:  Negative for blurred vision and double vision.  Respiratory:  Negative for cough and shortness of breath.   Cardiovascular:  Negative for chest pain and orthopnea.  Gastrointestinal:  Negative for nausea and vomiting.  Genitourinary:  Negative for dysuria, flank pain and frequency.  Musculoskeletal:  Negative for myalgias.  Skin:  Negative for rash.  Neurological:  Negative for dizziness, tingling, weakness and headaches.  Endo/Heme/Allergies:  Does not bruise/bleed easily.  Psychiatric/Behavioral:  Negative for depression and suicidal ideas. The patient is not nervous/anxious.     The following portions of the patient's history were reviewed and updated as appropriate: allergies, current medications, past family history, past medical history, past social history, past surgical history and problem list. Problem list updated.  Objective:   Vitals:   08/19/21 1447 08/19/21 1515  BP: (!) 135/94 123/85  Pulse: 84   Temp: (!) 97.1 F (36.2 C)   Weight: 172 lb 3.2 oz (78.1 kg)   Height: 4\' 11"  (1.499 m)     Physical Exam Constitutional:      Appearance: Normal appearance.  HENT:     Head: Normocephalic and atraumatic.  Pulmonary:     Effort: Pulmonary effort is normal.  Abdominal:  Palpations: Abdomen is soft.  Musculoskeletal:        General: Normal range of motion.  Skin:    General: Skin is warm and dry.  Neurological:     General: No focal deficit present.     Mental Status: She is alert.  Psychiatric:        Mood and Affect: Mood normal.        Behavior: Behavior normal.       Assessment and Plan:  Rhonda Kramer is a 36 y.o. female G3P3003 presenting to the [facility] Department for an yearly wellness and contraception visit   Contraception counseling: Reviewed options based on patient desire and  reproductive life plan. Patient is interested in Hormonal Injection. This was provided to the patient today.  Risks, benefits, and typical effectiveness rates were reviewed.    Questions were answered.  Written information was also given to the patient to review.    The patient will follow up in  1 years for surveillance.  The patient was told to call with any further questions, or with any concerns about this method of contraception.  Emphasized use of condoms 100% of the time for STI prevention.  Currently on menses, no ECP required.   1. Well woman exam with routine gynecological exam CBE done in Dec per patient. Declines today. Discussed breast awareness.  Pap last performed 06/07/21 Repeat pap in 1 year (May 2024) per ASCCP guidelines following colpo in 2021 with CIN1   Desires DEPO-  written for 1 year.   - medroxyPROGESTERone (DEPO-PROVERA) injection 150 mg  2. Screening examination for venereal disease - Chlamydia/Gonorrhea [facility]-- CANCELLED. Patient initially reported wanting this but decided to wait due to menses. She will make an appt with have vaginal infection testing.  - Declines blood work today   Return in about 3 months (around 11/19/2021) for Depo.  No future appointments.  Federico Flake, MD

## 2021-08-19 NOTE — Progress Notes (Signed)
Here for Physical Exam and Depo injection for birth control; has received in past approximately 14-15 yrs ago.  [employer] utilized #[id number]. Repeat BP 123/85 HR 72. Depo given along with appointment reminder card. Shiela Mayer RN

## 2021-11-18 ENCOUNTER — Ambulatory Visit: Payer: Self-pay

## 2021-11-18 ENCOUNTER — Ambulatory Visit (LOCAL_COMMUNITY_HEALTH_CENTER): Payer: Self-pay

## 2021-11-18 VITALS — BP 143/72 | Ht 59.0 in | Wt 177.0 lb

## 2021-11-18 DIAGNOSIS — Z3009 Encounter for other general counseling and advice on contraception: Secondary | ICD-10-CM

## 2021-11-18 DIAGNOSIS — Z3042 Encounter for surveillance of injectable contraceptive: Secondary | ICD-10-CM

## 2021-11-18 NOTE — Progress Notes (Signed)
Patient seen 13 weeks post Depo.  Voices no concerns.  Patient reported some spotting.  Medroxyprogesterone Acetate 150 mg. given per order dated 08/19/2021 by Lubertha Sayres, MD.  Given IM in right deltoid. Tolerated well.  B/P 143/78  - Consult with Donnal Moat, CNM regarding elevated systolic blood pressure and patient to see PCP for monitoring.  Patient told to see PCP for monitoring of blood pressure and patient expressed understanding.  Patient wanted information regarding vasectomy for spouse.  Pamphlet on Vasectomy program and contact shared with patient.  Reminder card provided for next Depo appointment - 02/03/2022. [employer] language line used. Sterling Big # [phone] - interpreter. Consulted on the plan of care for this client.  I agree with the documented note and actions taken to provide care for this client.  Ola Spurr, CNM

## 2022-05-03 ENCOUNTER — Telehealth: Payer: Self-pay | Admitting: Family Medicine

## 2022-05-03 NOTE — Telephone Encounter (Signed)
Pt called to schedule a PAP and/or PE, she states that something was abnormal from the tests performed at her last appt in August, 2023 AND that she was instructed to have it repeated in May of this year. Please confirm what exactly she needs to be scheduled for and when. Call her back w/Spanish interpreter or let the clerk know. Thanks

## 2022-05-29 ENCOUNTER — Ambulatory Visit: Payer: Self-pay | Admitting: Family Medicine

## 2022-06-22 ENCOUNTER — Ambulatory Visit: Payer: Self-pay

## 2022-07-28 ENCOUNTER — Ambulatory Visit: Payer: Self-pay

## 2022-08-25 ENCOUNTER — Ambulatory Visit (LOCAL_COMMUNITY_HEALTH_CENTER): Payer: Self-pay | Admitting: Family Medicine

## 2022-08-25 ENCOUNTER — Encounter: Payer: Self-pay | Admitting: Family Medicine

## 2022-08-25 VITALS — BP 147/87 | HR 84 | Wt 175.0 lb

## 2022-08-25 DIAGNOSIS — N879 Dysplasia of cervix uteri, unspecified: Secondary | ICD-10-CM

## 2022-08-25 DIAGNOSIS — Z3009 Encounter for other general counseling and advice on contraception: Secondary | ICD-10-CM

## 2022-08-25 DIAGNOSIS — Z01419 Encounter for gynecological examination (general) (routine) without abnormal findings: Secondary | ICD-10-CM

## 2022-08-25 NOTE — Progress Notes (Signed)
[facility] DEPARTMENT [facility] [address] Main Number: [phone]  Family Planning Visit- Repeat Yearly Visit  Subjective:  Rhonda Kramer is a 37 y.o. G3P3003  being seen today for an annual wellness visit and to discuss contraception options.   The patient is currently using Female Condom for pregnancy prevention. Patient does not want a pregnancy in the next year.    report they are looking for a method that provides Ready when they are    Patient has the following medical problems: has Cervical dysplasia on their problem list.  Chief Complaint  Patient presents with   Annual Exam    Patient reports to clinic for PE and repeat pap  Patient denies concerns about self- reports some sweating at night. Discussed thyroid regulation vs perimenopausal symptoms. Encouraged to seek PCP for eval   See flowsheet for other program required questions.   Body mass index is 35.35 kg/m. - Patient is eligible for diabetes screening based on BMI> 25 and age >35?  yes HA1C ordered? Practitioner oversight- forgot to order  Patient reports 1 of partners in last year. Desires STI screening?  No - declined   Has patient been screened once for HCV in the past?  No  No results found for: "HCVAB"  Does the patient have current of drug use, have a partner with drug use, and/or has been incarcerated since last result? No  If yes-- Screen for HCV through [facility]   Does the patient meet criteria for HBV testing? No  Criteria:  -Household, sexual or needle sharing contact with HBV -History of drug use -HIV positive -Those with known Hep C   Health Maintenance Due  Topic Date Due   HEMOGLOBIN A1C  Never done   FOOT EXAM  Never done   OPHTHALMOLOGY EXAM  Never done   HIV Screening  Never done   Diabetic kidney evaluation - Urine ACR  Never done   Hepatitis C Screening  Never done   COVID-19 Vaccine (1 - 2023-24 season) Never done    Diabetic kidney evaluation - eGFR measurement  04/12/2022   INFLUENZA VACCINE  08/17/2022    Review of Systems  Constitutional:  Negative for weight loss.  Eyes:  Negative for blurred vision.  Respiratory:  Negative for cough and shortness of breath.   Cardiovascular:  Negative for claudication.  Gastrointestinal:  Negative for nausea.  Genitourinary:  Negative for dysuria and frequency.  Skin:  Negative for rash.  Neurological:  Negative for headaches.  Endo/Heme/Allergies:  Does not bruise/bleed easily.    The following portions of the patient's history were reviewed and updated as appropriate: allergies, current medications, past family history, past medical history, past social history, past surgical history and problem list. Problem list updated.  Objective:   Vitals:   08/25/22 0829  BP: (!) 147/90  Pulse: 84  Weight: 175 lb (79.4 kg)    Physical Exam Vitals and nursing note reviewed.  Constitutional:      General: She is not in acute distress.    Appearance: Normal appearance. She is well-developed.  HENT:     Head: Normocephalic and atraumatic.     Nose: Nose normal. No congestion or rhinorrhea.  Eyes:     General: No scleral icterus.    Conjunctiva/sclera: Conjunctivae normal.  Neck:     Thyroid: No thyroid mass or thyromegaly.  Cardiovascular:     Rate and Rhythm: Normal rate.     Pulses: Normal  pulses.     Comments: Extremities are warm and well perfused Pulmonary:     Effort: Pulmonary effort is normal.     Breath sounds: Normal breath sounds.  Chest:     Chest wall: No mass.  Breasts:    Tanner Score is 5.     Breasts are symmetrical.     Right: Normal. No mass, nipple discharge or skin change.     Left: Normal. No mass, nipple discharge or skin change.  Abdominal:     Palpations: Abdomen is soft.     Comments: Gravid   Genitourinary:    General: Normal vulva.     Exam position: Lithotomy position.     Pubic Area: No rash or pubic lice.       Labia:        Right: No rash or lesion.        Left: No rash or lesion.      Vagina: Normal. No vaginal discharge, erythema, bleeding or lesions.     Cervix: No cervical motion tenderness, discharge, friability, lesion or erythema.     Uterus: Normal. Not tender.      Adnexa: Right adnexa normal and left adnexa normal.     Rectum: Normal. No external hemorrhoid.     Comments: pH = 4 Musculoskeletal:     Right lower leg: No edema.     Left lower leg: No edema.  Lymphadenopathy:     Head:     Right side of head: No preauricular or posterior auricular adenopathy.     Left side of head: No preauricular or posterior auricular adenopathy.     Cervical: No cervical adenopathy.     Upper Body:     Right upper body: No supraclavicular, axillary or epitrochlear adenopathy.     Left upper body: No supraclavicular, axillary or epitrochlear adenopathy.     Lower Body: No right inguinal adenopathy. No left inguinal adenopathy.  Skin:    General: Skin is warm and dry.     Capillary Refill: Capillary refill takes less than 2 seconds.     Findings: No rash.  Neurological:     Mental Status: She is alert and oriented to person, place, and time.       Assessment and Plan:  Rhonda Kramer is a 37 y.o. female G3P3003 presenting to the [facility] Department for an yearly wellness and contraception visit  1. Well woman exam with routine gynecological exam -CBE today (normal), given information on BCCCP- encouraged to call for appointment after she turns 38 -BP elevated today- encouraged to establish care with PCP for evaluation of HTN  2. Cervical dysplasia -repeat pap today  - IGP, Aptima HPV  3. Family planning Contraception counseling: Reviewed options based on patient desire and reproductive life plan. Patient is interested in Female Condom. This was provided to the patient today.   Risks, benefits, and typical effectiveness rates were reviewed.  Questions were answered.   Written information was also given to the patient to review.    The patient will follow up in  1 years for surveillance.  The patient was told to call with any further questions, or with any concerns about this method of contraception.  Emphasized use of condoms 100% of the time for STI prevention.  Educated on ECP and assessed need for ECP. Not indicated- uses female condoms regularly   Return in about 1 year (around 08/25/2023) for annual well-woman exam.  No future appointments. Due to language  barrier, interpreter Estill Dooms was present for this visit.  Rhonda Kramer, [place]

## 2022-08-25 NOTE — Progress Notes (Signed)
Pt is here for family planning visit.  Family planning packet reviewed and given to pt.  Condoms given. Gaspar Garbe, RN

## 2022-10-17 ENCOUNTER — Ambulatory Visit (LOCAL_COMMUNITY_HEALTH_CENTER): Payer: Self-pay

## 2022-10-17 VITALS — BP 156/90 | Ht 59.0 in | Wt 176.5 lb

## 2022-10-17 DIAGNOSIS — Z3202 Encounter for pregnancy test, result negative: Secondary | ICD-10-CM

## 2022-10-17 DIAGNOSIS — Z30012 Encounter for prescription of emergency contraception: Secondary | ICD-10-CM

## 2022-10-17 DIAGNOSIS — Z3009 Encounter for other general counseling and advice on contraception: Secondary | ICD-10-CM

## 2022-10-17 LAB — PREGNANCY, URINE: Preg Test, Ur: NEGATIVE

## 2022-10-17 MED ORDER — ELLA 30 MG PO TABS: 1.0000 | ORAL_TABLET | Freq: Once | ORAL | Status: AC

## 2022-10-17 NOTE — Progress Notes (Signed)
In nurse clinic requesting ECP. States unprotected sex yesterday. Reports last ECP  7 months ago without problem. Hx of taking bcm: IUD, ocp, implant, depo with complaints of headaches, hair loss, abd pain.   BP elevated 156/90.  Denies Headache today.  Has provider at [facility] clinic but has not had BP evaluated. RN advised patient to f-u with provider.  LMP 09/13/22- lighter than normal Unprotected sex 9/30, 9/20, 9/12  Consult Aliene Altes, FNP and informed of patient status. Provider orders UPT and if negative, patient may have Jordan.   UPT negative.  The patient was dispensed Samson Frederic today per order Aliene Altes, FNP. I provided counseling today regarding the medication. We discussed the medication, the side effects and when to call clinic. Patient given the opportunity to ask questions. Questions answered.   ECP info sheet reviewed with patient and copy given.  ECP consent signed.  RN encouraged patient to schedule [facility] appt to discuss bcm. Questions answered and reports understanding.  Lang line [phone]. Jerel Shepherd, RN
